# Patient Record
Sex: Female | Born: 1975 | Race: White | Hispanic: No | Marital: Married | Smoking: Never smoker
Health system: Southern US, Community
[De-identification: ages and names within clinical notes are randomized; demographics above are authoritative.]

## PROBLEM LIST (undated history)

## (undated) DIAGNOSIS — O09299 Supervision of pregnancy with other poor reproductive or obstetric history, unspecified trimester: Secondary | ICD-10-CM

## (undated) DIAGNOSIS — R87619 Unspecified abnormal cytological findings in specimens from cervix uteri: Secondary | ICD-10-CM

## (undated) DIAGNOSIS — IMO0002 Reserved for concepts with insufficient information to code with codable children: Secondary | ICD-10-CM

## (undated) DIAGNOSIS — F32A Depression, unspecified: Secondary | ICD-10-CM

## (undated) DIAGNOSIS — F329 Major depressive disorder, single episode, unspecified: Secondary | ICD-10-CM

## (undated) DIAGNOSIS — Z8619 Personal history of other infectious and parasitic diseases: Secondary | ICD-10-CM

## (undated) DIAGNOSIS — Z8632 Personal history of gestational diabetes: Secondary | ICD-10-CM

## (undated) DIAGNOSIS — Z8742 Personal history of other diseases of the female genital tract: Secondary | ICD-10-CM

## (undated) DIAGNOSIS — O09529 Supervision of elderly multigravida, unspecified trimester: Secondary | ICD-10-CM

## (undated) HISTORY — DX: Personal history of other diseases of the female genital tract: Z87.42

## (undated) HISTORY — DX: Supervision of elderly multigravida, unspecified trimester: O09.529

## (undated) HISTORY — DX: Major depressive disorder, single episode, unspecified: F32.9

## (undated) HISTORY — PX: APPENDECTOMY: SHX54

## (undated) HISTORY — DX: Personal history of gestational diabetes: Z86.32

## (undated) HISTORY — DX: Depression, unspecified: F32.A

## (undated) HISTORY — DX: Reserved for concepts with insufficient information to code with codable children: IMO0002

## (undated) HISTORY — DX: Supervision of pregnancy with other poor reproductive or obstetric history, unspecified trimester: O09.299

## (undated) HISTORY — PX: COLPOSCOPY: SHX161

## (undated) HISTORY — DX: Personal history of other infectious and parasitic diseases: Z86.19

## (undated) HISTORY — DX: Unspecified abnormal cytological findings in specimens from cervix uteri: R87.619

---

## 2011-08-25 NOTE — L&D Delivery Note (Signed)
Delivery Note At 11:01 PM a viable female was delivered via Vaginal, Spontaneous Delivery (Presentation: ;  ).  APGAR: 8, 9; weight .   Placenta status: Intact, Spontaneous.  Cord: 3 vessels with the following complications: None.  Cord pH: not sent  Anesthesia: Epidural  Episiotomy: None Lacerations: 2nd degree;Vaginal Suture Repair: 3.0 chromic Est. Blood Loss (mL): 400   bilat superficial periurethral lac repaired, sec deg perineal lac repaired>>I/O cath at end>>clear urine Mom to postpartum.  Baby to nursery-stable.  Meriel Pica 03/26/2012, 11:22 PM

## 2011-09-08 LAB — OB RESULTS CONSOLE ABO/RH: RH Type: POSITIVE

## 2011-09-08 LAB — OB RESULTS CONSOLE HEPATITIS B SURFACE ANTIGEN: Hepatitis B Surface Ag: NEGATIVE

## 2011-09-08 LAB — OB RESULTS CONSOLE HIV ANTIBODY (ROUTINE TESTING): HIV: NONREACTIVE

## 2012-02-03 ENCOUNTER — Encounter: Payer: 59 | Attending: Obstetrics and Gynecology | Admitting: *Deleted

## 2012-02-03 DIAGNOSIS — O99810 Abnormal glucose complicating pregnancy: Secondary | ICD-10-CM | POA: Insufficient documentation

## 2012-02-03 DIAGNOSIS — Z713 Dietary counseling and surveillance: Secondary | ICD-10-CM | POA: Insufficient documentation

## 2012-02-05 ENCOUNTER — Encounter: Payer: Self-pay | Admitting: *Deleted

## 2012-02-05 NOTE — Progress Notes (Signed)
  Patient was seen on 02/03/2012 for Gestational Diabetes self-management class at the [facility]. The following learning objectives were met by the patient during this course:   States the definition of Gestational Diabetes  States why dietary management is important in controlling blood glucose  Describes the effects each nutrient has on blood glucose levels  Demonstrates ability to create a balanced meal plan  Demonstrates carbohydrate counting   States when to check blood glucose levels  Demonstrates proper blood glucose monitoring techniques  States the effect of stress and exercise on blood glucose levels  States the importance of limiting caffeine and abstaining from alcohol and smoking  Blood glucose monitor given:  One Touch Ultra Mini Self Monitoring Kit Lot # [id number] x Exp: 04/2012 Blood glucose reading: 89 mg/dl  Patient instructed to monitor glucose levels: FBS: 60 - <90 2 hour: <120  *Patient received handouts:  Nutrition Diabetes and Pregnancy  Carbohydrate Counting List  Patient will be seen for follow-up as needed.

## 2012-02-05 NOTE — Patient Instructions (Signed)
Goals:  Check glucose levels per MD as instructed  Follow Gestational Diabetes Diet as instructed  Call for follow-up as needed    

## 2012-03-15 ENCOUNTER — Encounter (HOSPITAL_COMMUNITY): Payer: Self-pay | Admitting: *Deleted

## 2012-03-15 ENCOUNTER — Telehealth (HOSPITAL_COMMUNITY): Payer: Self-pay | Admitting: *Deleted

## 2012-03-15 NOTE — Telephone Encounter (Signed)
Preadmission screen  

## 2012-03-24 ENCOUNTER — Encounter (HOSPITAL_COMMUNITY): Payer: Self-pay

## 2012-03-24 ENCOUNTER — Inpatient Hospital Stay (HOSPITAL_COMMUNITY)
Admission: RE | Admit: 2012-03-24 | Discharge: 2012-03-28 | DRG: 775 | Disposition: A | Discharge status: Home / Self Care | Payer: 59 | Source: Ambulatory Visit | Attending: Obstetrics and Gynecology | Admitting: Obstetrics and Gynecology

## 2012-03-24 DIAGNOSIS — O4100X0 Oligohydramnios, unspecified trimester, not applicable or unspecified: Principal | ICD-10-CM | POA: Diagnosis present

## 2012-03-24 DIAGNOSIS — O99814 Abnormal glucose complicating childbirth: Secondary | ICD-10-CM | POA: Diagnosis present

## 2012-03-24 DIAGNOSIS — O09529 Supervision of elderly multigravida, unspecified trimester: Secondary | ICD-10-CM | POA: Diagnosis present

## 2012-03-24 LAB — CBC
HCT: 35.1 % — ABNORMAL LOW (ref 36.0–46.0)
MCHC: 34.8 g/dL (ref 30.0–36.0)
RDW: 14.3 % (ref 11.5–15.5)
WBC: 9.8 10*3/uL (ref 4.0–10.5)

## 2012-03-24 MED ORDER — MISOPROSTOL 25 MCG QUARTER TABLET
25.0000 ug | ORAL_TABLET | ORAL | Status: DC | PRN
  Administered 2012-03-24 – 2012-03-25 (×2): 25 ug via VAGINAL
  Filled 2012-03-24 (×2): qty 0.25

## 2012-03-24 MED ORDER — GLYBURIDE 2.5 MG PO TABS
2.5000 mg | ORAL_TABLET | Freq: Every evening | ORAL | Status: AC
  Administered 2012-03-24: 2.5 mg via ORAL
  Filled 2012-03-24: qty 1

## 2012-03-24 MED ORDER — ONDANSETRON HCL 4 MG/2ML IJ SOLN: 4.0000 mg | Freq: Four times a day (QID) | INTRAMUSCULAR | Status: DC | PRN

## 2012-03-24 MED ORDER — ACETAMINOPHEN 325 MG PO TABS: 650.0000 mg | ORAL_TABLET | ORAL | Status: DC | PRN

## 2012-03-24 MED ORDER — LACTATED RINGERS IV SOLN
INTRAVENOUS | Status: DC
  Administered 2012-03-25 (×2): via INTRAVENOUS
  Administered 2012-03-26: 125 mL/h via INTRAVENOUS
  Administered 2012-03-26: 12:00:00 via INTRAVENOUS

## 2012-03-24 MED ORDER — SODIUM CHLORIDE 0.9 % IV SOLN: 2.0000 g | Freq: Once | INTRAVENOUS | Status: DC

## 2012-03-24 MED ORDER — CITRIC ACID-SODIUM CITRATE 334-500 MG/5ML PO SOLN: 30.0000 mL | ORAL | Status: DC | PRN

## 2012-03-24 MED ORDER — OXYCODONE-ACETAMINOPHEN 5-325 MG PO TABS
1.0000 | ORAL_TABLET | ORAL | Status: DC | PRN
  Administered 2012-03-26: 1 via ORAL
  Filled 2012-03-24: qty 1

## 2012-03-24 MED ORDER — TERBUTALINE SULFATE 1 MG/ML IJ SOLN: 0.2500 mg | Freq: Once | INTRAMUSCULAR | Status: AC | PRN

## 2012-03-24 MED ORDER — IBUPROFEN 600 MG PO TABS
600.0000 mg | ORAL_TABLET | Freq: Four times a day (QID) | ORAL | Status: DC | PRN
  Administered 2012-03-26: 600 mg via ORAL
  Filled 2012-03-24: qty 1

## 2012-03-24 MED ORDER — OXYTOCIN BOLUS FROM INFUSION
250.0000 mL | Freq: Once | INTRAVENOUS | Status: AC
  Administered 2012-03-26: 250 mL via INTRAVENOUS
  Filled 2012-03-24: qty 500

## 2012-03-24 MED ORDER — OXYTOCIN 40 UNITS IN LACTATED RINGERS INFUSION - SIMPLE MED: 62.5000 mL/h | Freq: Once | INTRAVENOUS | Status: DC

## 2012-03-24 MED ORDER — LACTATED RINGERS IV SOLN: 500.0000 mL | INTRAVENOUS | Status: DC | PRN

## 2012-03-24 MED ORDER — FLEET ENEMA 7-19 GM/118ML RE ENEM: 1.0000 | ENEMA | RECTAL | Status: DC | PRN

## 2012-03-24 MED ORDER — SODIUM CHLORIDE 0.9 % IV SOLN
2.0000 g | Freq: Once | INTRAVENOUS | Status: DC
  Filled 2012-03-24: qty 2000

## 2012-03-24 MED ORDER — ZOLPIDEM TARTRATE 5 MG PO TABS
5.0000 mg | ORAL_TABLET | Freq: Every evening | ORAL | Status: DC | PRN
  Administered 2012-03-25 (×2): 5 mg via ORAL
  Filled 2012-03-24 (×3): qty 1

## 2012-03-24 MED ORDER — LIDOCAINE HCL (PF) 1 % IJ SOLN
30.0000 mL | INTRAMUSCULAR | Status: DC | PRN
  Filled 2012-03-24: qty 30

## 2012-03-24 NOTE — H&P (Signed)
Melissa [name] is a 36 y.o. female presenting for induction of labor secondary to oligohydramnios. AF pocket 2.0-2.5 noted. Also, GDM on glyburide. Consultation with Dr Melissa [name] of MFM formulates plan for induction at 37 weeks. Patient denies CNS changes/epigastric pain/ ROM. Maternal Medical History:  Fetal activity: Perceived fetal activity is normal.      OB History    Grav Para Term Preterm Abortions TAB SAB Ect Mult Living   3 0   2  2        Past Medical History  Diagnosis Date  . H/O varicella   . Abnormal Pap smear   . Depression   . AMA (advanced maternal age) multigravida 35+   . History of PCOS    Past Surgical History  Procedure Date  . Appendectomy   . Colposcopy    Family History: family history includes Cancer in her paternal grandfather and paternal grandmother; Diabetes in her mother; Hypertension in her father; Stroke in her mother; and Thyroid disease in her mother. Social History:  reports that she has never smoked. She has never used smokeless tobacco. She reports that she does not drink alcohol or use illicit drugs.   Prenatal Transfer Tool  Maternal Diabetes: Yes:  Diabetes Type:  Insulin/Medication controlled Genetic Screening: Normal Maternal Ultrasounds/Referrals: Abnormal:  Findings:   Other: Please see prenatal record for details Fetal Ultrasounds or other Referrals:  Referred to Materal Fetal Medicine  Maternal Substance Abuse:  No Significant Maternal Medications:  Meds include: Other: see prenatal record Significant Maternal Lab Results:  None Other Comments:  GDM on glyburide  Review of Systems  Eyes: Negative for blurred vision.  Gastrointestinal: Negative for abdominal pain.  Neurological: Negative for headaches.    Dilation: 1 Effacement (%): Thick Station: -3 Exam by:: DrMarland Kitchen Melissa [name] Last menstrual period 07/10/2011. Maternal Exam:  Abdomen: Fetal presentation: vertex     Fetal Exam Fetal Monitor Review: Pattern:  accelerations present.       Physical Exam  Cardiovascular: Normal rate and regular rhythm.   Respiratory: Effort normal and breath sounds normal.  GI: There is no tenderness.  Neurological: She has normal reflexes.   Cx 1/thick/soft/-3/vertex Prenatal labs: ABO, Rh: A/Positive/-- (01/15 0000) Antibody: Negative (01/15 0000) Rubella: Immune (01/15 0000) RPR: Nonreactive (01/15 0000)  HBsAg: Negative (01/15 0000)  HIV: Non-reactive (01/15 0000)  GBS:     Assessment/Plan: 36 yo G3P0 at 36 6/7 weeks with oligohydramnios and DGM on glyburide for 2 stage induction. D/W patient induction and risks including failed induction, fetal distress, emergency cesarean section.   Melissa [name],Melissa [name] 03/24/2012, 8:53 PM

## 2012-03-25 LAB — TYPE AND SCREEN
ABO/RH(D): A POS
Antibody Screen: NEGATIVE

## 2012-03-25 LAB — ABO/RH: ABO/RH(D): A POS

## 2012-03-25 LAB — RPR: RPR Ser Ql: NONREACTIVE

## 2012-03-25 LAB — GLUCOSE, CAPILLARY

## 2012-03-25 MED ORDER — FENTANYL 2.5 MCG/ML BUPIVACAINE 1/10 % EPIDURAL INFUSION (WH - ANES)
14.0000 mL/h | INTRAMUSCULAR | Status: DC
  Administered 2012-03-26 (×2): 14 mL/h via EPIDURAL
  Filled 2012-03-25 (×2): qty 60

## 2012-03-25 MED ORDER — TERBUTALINE SULFATE 1 MG/ML IJ SOLN: 0.2500 mg | Freq: Once | INTRAMUSCULAR | Status: AC | PRN

## 2012-03-25 MED ORDER — EPHEDRINE 5 MG/ML INJ
10.0000 mg | INTRAVENOUS | Status: DC | PRN
  Filled 2012-03-25: qty 4

## 2012-03-25 MED ORDER — PHENYLEPHRINE 40 MCG/ML (10ML) SYRINGE FOR IV PUSH (FOR BLOOD PRESSURE SUPPORT): 80.0000 ug | PREFILLED_SYRINGE | INTRAVENOUS | Status: DC | PRN

## 2012-03-25 MED ORDER — PHENYLEPHRINE 40 MCG/ML (10ML) SYRINGE FOR IV PUSH (FOR BLOOD PRESSURE SUPPORT)
80.0000 ug | PREFILLED_SYRINGE | INTRAVENOUS | Status: DC | PRN
  Filled 2012-03-25: qty 5

## 2012-03-25 MED ORDER — MISOPROSTOL 25 MCG QUARTER TABLET
25.0000 ug | ORAL_TABLET | ORAL | Status: DC | PRN
  Administered 2012-03-25 – 2012-03-26 (×2): 25 ug via VAGINAL
  Filled 2012-03-25 (×2): qty 0.25

## 2012-03-25 MED ORDER — LACTATED RINGERS IV SOLN: 500.0000 mL | Freq: Once | INTRAVENOUS | Status: DC

## 2012-03-25 MED ORDER — OXYTOCIN 40 UNITS IN LACTATED RINGERS INFUSION - SIMPLE MED
1.0000 m[IU]/min | INTRAVENOUS | Status: DC
  Administered 2012-03-25: 2 m[IU]/min via INTRAVENOUS
  Filled 2012-03-25 (×2): qty 1000

## 2012-03-25 MED ORDER — DIPHENHYDRAMINE HCL 50 MG/ML IJ SOLN: 12.5000 mg | INTRAMUSCULAR | Status: DC | PRN

## 2012-03-25 MED ORDER — GLYBURIDE 2.5 MG PO TABS
2.5000 mg | ORAL_TABLET | Freq: Every evening | ORAL | Status: AC
  Administered 2012-03-25: 2.5 mg via ORAL
  Filled 2012-03-25: qty 1

## 2012-03-25 MED ORDER — EPHEDRINE 5 MG/ML INJ
10.0000 mg | INTRAVENOUS | Status: DC | PRN
Start: 2012-03-25 — End: 2012-03-27

## 2012-03-25 NOTE — Progress Notes (Signed)
Patient ID: Melissa [name], female   DOB: December 06, 1975, 36 y.o.   MRN: [id number] Received cytotec X 2>>now 1+/post, will start pitocin per protocol

## 2012-03-25 NOTE — Progress Notes (Signed)
No substantial change in cx, will DC pitocin, allow to eat, shower and re- prime with Cytotec tonight.

## 2012-03-26 ENCOUNTER — Inpatient Hospital Stay (HOSPITAL_COMMUNITY): Payer: 59 | Admitting: Anesthesiology

## 2012-03-26 ENCOUNTER — Encounter (HOSPITAL_COMMUNITY): Payer: Self-pay | Admitting: Anesthesiology

## 2012-03-26 LAB — GLUCOSE, CAPILLARY: Glucose-Capillary: 76 mg/dL (ref 70–99)

## 2012-03-26 MED ORDER — OXYTOCIN 40 UNITS IN LACTATED RINGERS INFUSION - SIMPLE MED
1.0000 m[IU]/min | INTRAVENOUS | Status: DC
  Administered 2012-03-26: 10 m[IU]/min via INTRAVENOUS

## 2012-03-26 MED ORDER — LIDOCAINE HCL (PF) 1 % IJ SOLN
INTRAMUSCULAR | Status: DC | PRN
  Administered 2012-03-26 (×3): 4 mL

## 2012-03-26 MED ORDER — TERBUTALINE SULFATE 1 MG/ML IJ SOLN: 0.2500 mg | Freq: Once | INTRAMUSCULAR | Status: AC | PRN

## 2012-03-26 NOTE — Progress Notes (Signed)
Attempted to check pt cervix--cervix posterior unable to determine SVE

## 2012-03-26 NOTE — Progress Notes (Signed)
Pt sitting up in bed--difficult to trace

## 2012-03-26 NOTE — Progress Notes (Signed)
Patient ID: Melissa [name], female   DOB: 09-Oct-1975, 36 y.o.   MRN: [id number] Now on pit 86miu/mi, no change in cx, discussed CS for failed induction vs day #3 induction , will recheck about 6 PM

## 2012-03-26 NOTE — Progress Notes (Addendum)
Dr Marcelle Overlie notified that glyburide was not ordered for pt after meal. Orders received to give glyburide and take a fasting blood sugar in the am. Will continue to monitor.

## 2012-03-26 NOTE — Anesthesia Preprocedure Evaluation (Signed)
Anesthesia Evaluation  Patient identified by MRN, date of birth, ID band Patient awake    Reviewed: Allergy & Precautions, H&P , NPO status , Patient's Chart, lab work & pertinent test results, reviewed documented beta blocker date and time   History of Anesthesia Complications Negative for: history of anesthetic complications  Airway Mallampati: I TM Distance: >3 FB Neck ROM: full    Dental  (+) Teeth Intact   Pulmonary neg pulmonary ROS,  breath sounds clear to auscultation        Cardiovascular negative cardio ROS  Rhythm:regular Rate:Normal     Neuro/Psych negative neurological ROS  negative psych ROS   GI/Hepatic negative GI ROS, Neg liver ROS,   Endo/Other  Gestational, Oral Hypoglycemic AgentsMorbid obesityPCOS  Renal/GU negative Renal ROS  negative genitourinary   Musculoskeletal   Abdominal   Peds  Hematology negative hematology ROS (+)   Anesthesia Other Findings   Reproductive/Obstetrics (+) Pregnancy                           Anesthesia Physical Anesthesia Plan  ASA: III  Anesthesia Plan: Epidural   Post-op Pain Management:    Induction:   Airway Management Planned:   Additional Equipment:   Intra-op Plan:   Post-operative Plan:   Informed Consent: I have reviewed the patients History and Physical, chart, labs and discussed the procedure including the risks, benefits and alternatives for the proposed anesthesia with the patient or authorized representative who has indicated his/her understanding and acceptance.     Plan Discussed with:   Anesthesia Plan Comments:         Anesthesia Quick Evaluation

## 2012-03-26 NOTE — Anesthesia Procedure Notes (Signed)
Epidural Patient location during procedure: OB Start time: 03/26/2012 7:10 PM Reason for block: procedure for pain  Staffing Performed by: anesthesiologist   Preanesthetic Checklist Completed: patient identified, site marked, surgical consent, pre-op evaluation, timeout performed, IV checked, risks and benefits discussed and monitors and equipment checked  Epidural Patient position: sitting Prep: site prepped and draped and DuraPrep Patient monitoring: continuous pulse ox and blood pressure Approach: midline Injection technique: LOR air  Needle:  Needle type: Tuohy  Needle gauge: 17 G Needle length: 9 cm Needle insertion depth: 5 cm cm Catheter type: closed end flexible Catheter size: 19 Gauge Catheter at skin depth: 10 cm Test dose: negative  Assessment Events: blood not aspirated, injection not painful, no injection resistance, negative IV test and no paresthesia  Additional Notes Discussed risk of headache, infection, bleeding, nerve injury and failed or incomplete block.  Patient voices understanding and wishes to proceed.

## 2012-03-26 NOTE — Progress Notes (Signed)
Dr. Marcelle Overlie notified of pt status, FHR, decelerations, nursing interventions, and SVE. Will continue to monitor.

## 2012-03-26 NOTE — Progress Notes (Signed)
Patient ID: Melissa [name], female   DOB: 18-Mar-1976, 36 y.o.   MRN: [id number] [redacted]w[redacted]d   Day #2 induction, rec'd cytotec again last PM>>cx now 1/thk/post still, stable FHR, will start pitocin per protocol

## 2012-03-26 NOTE — Progress Notes (Signed)
Dr. Marcelle Overlie notified of SVE, FHR and decelerations. MD wants pt to begin pushing. Will continue to monitor.

## 2012-03-26 NOTE — Progress Notes (Signed)
Patient ID: Melissa [name], female   DOB: November 25, 1975, 36 y.o.   MRN: [id number] About to get epidural, SROM about 1 hr ago, now 4 cm

## 2012-03-27 ENCOUNTER — Encounter (HOSPITAL_COMMUNITY): Payer: Self-pay

## 2012-03-27 LAB — CBC
HCT: 34.7 % — ABNORMAL LOW (ref 36.0–46.0)
Hemoglobin: 11.8 g/dL — ABNORMAL LOW (ref 12.0–15.0)
MCV: 91.8 fL (ref 78.0–100.0)
WBC: 13 10*3/uL — ABNORMAL HIGH (ref 4.0–10.5)

## 2012-03-27 MED ORDER — DIPHENHYDRAMINE HCL 25 MG PO CAPS: 25.0000 mg | ORAL_CAPSULE | Freq: Four times a day (QID) | ORAL | Status: DC | PRN

## 2012-03-27 MED ORDER — DIBUCAINE 1 % RE OINT: 1.0000 "application " | TOPICAL_OINTMENT | RECTAL | Status: DC | PRN

## 2012-03-27 MED ORDER — ONDANSETRON HCL 4 MG/2ML IJ SOLN: 4.0000 mg | INTRAMUSCULAR | Status: DC | PRN

## 2012-03-27 MED ORDER — BISACODYL 10 MG RE SUPP: 10.0000 mg | Freq: Every day | RECTAL | Status: DC | PRN

## 2012-03-27 MED ORDER — MEASLES, MUMPS & RUBELLA VAC ~~LOC~~ INJ
0.5000 mL | INJECTION | Freq: Once | SUBCUTANEOUS | Status: DC
  Filled 2012-03-27: qty 0.5

## 2012-03-27 MED ORDER — TETANUS-DIPHTH-ACELL PERTUSSIS 5-2.5-18.5 LF-MCG/0.5 IM SUSP: 0.5000 mL | Freq: Once | INTRAMUSCULAR | Status: DC

## 2012-03-27 MED ORDER — LANOLIN HYDROUS EX OINT: TOPICAL_OINTMENT | CUTANEOUS | Status: DC | PRN

## 2012-03-27 MED ORDER — SIMETHICONE 80 MG PO CHEW: 80.0000 mg | CHEWABLE_TABLET | ORAL | Status: DC | PRN

## 2012-03-27 MED ORDER — ZOLPIDEM TARTRATE 5 MG PO TABS: 5.0000 mg | ORAL_TABLET | Freq: Every evening | ORAL | Status: DC | PRN

## 2012-03-27 MED ORDER — SENNOSIDES-DOCUSATE SODIUM 8.6-50 MG PO TABS
2.0000 | ORAL_TABLET | Freq: Every day | ORAL | Status: DC
  Administered 2012-03-27: 2 via ORAL

## 2012-03-27 MED ORDER — BENZOCAINE-MENTHOL 20-0.5 % EX AERO
1.0000 "application " | INHALATION_SPRAY | CUTANEOUS | Status: DC | PRN
  Administered 2012-03-27: 1 via TOPICAL
  Filled 2012-03-27: qty 56

## 2012-03-27 MED ORDER — IBUPROFEN 800 MG PO TABS
800.0000 mg | ORAL_TABLET | Freq: Three times a day (TID) | ORAL | Status: DC | PRN
  Administered 2012-03-27 – 2012-03-28 (×2): 800 mg via ORAL
  Filled 2012-03-27 (×4): qty 1

## 2012-03-27 MED ORDER — FLEET ENEMA 7-19 GM/118ML RE ENEM: 1.0000 | ENEMA | Freq: Every day | RECTAL | Status: DC | PRN

## 2012-03-27 MED ORDER — WITCH HAZEL-GLYCERIN EX PADS: 1.0000 "application " | MEDICATED_PAD | CUTANEOUS | Status: DC | PRN

## 2012-03-27 MED ORDER — ONDANSETRON HCL 4 MG PO TABS: 4.0000 mg | ORAL_TABLET | ORAL | Status: DC | PRN

## 2012-03-27 MED ORDER — OXYCODONE-ACETAMINOPHEN 5-325 MG PO TABS
1.0000 | ORAL_TABLET | Freq: Four times a day (QID) | ORAL | Status: DC | PRN
  Administered 2012-03-27 – 2012-03-28 (×5): 1 via ORAL
  Filled 2012-03-27 (×5): qty 1

## 2012-03-27 MED ORDER — PRENATAL MULTIVITAMIN CH
1.0000 | ORAL_TABLET | Freq: Every day | ORAL | Status: DC
  Administered 2012-03-27 – 2012-03-28 (×2): 1 via ORAL
  Filled 2012-03-27: qty 1

## 2012-03-27 NOTE — Progress Notes (Signed)
Spoke with MOB briefly, was on prozac for symptoms, plans to restart now that infant has been born.  No current concerns.  Patient was referred for history of depression/anxiety. * Referral screened out by Clinical Social Worker because none of the following criteria appear to apply: ~ History of anxiety/depression during this pregnancy, or of post-partum depression. ~ Diagnosis of anxiety and/or depression within last 3 years ~ History of depression due to pregnancy loss/loss of child OR * Patient's symptoms currently being treated with medication and/or therapy. Please contact the Clinical Social Worker if needs arise, or by the patient's request. 

## 2012-03-27 NOTE — Progress Notes (Signed)
Post Partum Day 1 Subjective: no complaints  Objective: Blood pressure 129/87, pulse 91, temperature 97.8 F (36.6 C), temperature source Oral, resp. rate 20, height 5\' 7"  (1.702 m), weight 225 lb (102.059 kg), last menstrual period 07/10/2011, SpO2 100.00%, unknown if currently breastfeeding.  Physical Exam:  General: alert Lochia: appropriate Uterine Fundus: firm Incision: healing well DVT Evaluation: No evidence of DVT seen on physical exam.   Basename 03/27/12 0520 03/24/12 2115  HGB 11.8* 12.2  HCT 34.7* 35.1*    Assessment/Plan: Plan for discharge tomorrow   LOS: 3 days   Deloss Amico M 03/27/2012, 9:16 AM

## 2012-03-27 NOTE — Anesthesia Postprocedure Evaluation (Signed)
  Anesthesia Post-op Note  Patient: Melissa [name]  Procedure(s) Performed: * No surgery found *  Patient Location: Mother/Baby  Anesthesia Type: Epidural  Level of Consciousness: awake, alert  and oriented  Airway and Oxygen Therapy: Patient Spontanous Breathing  Post-op Pain: mild  Post-op Assessment: Patient's Cardiovascular Status Stable and Respiratory Function Stable  Post-op Vital Signs: stable  Complications: No apparent anesthesia complications

## 2012-03-28 MED ORDER — OXYCODONE-ACETAMINOPHEN 5-325 MG PO TABS: 1.0000 | ORAL_TABLET | Freq: Four times a day (QID) | ORAL | Status: DC | PRN

## 2012-03-28 MED ORDER — IBUPROFEN 800 MG PO TABS: 800.0000 mg | ORAL_TABLET | Freq: Three times a day (TID) | ORAL | Status: DC | PRN

## 2012-03-28 NOTE — Discharge Summary (Signed)
Obstetric Discharge Summary Reason for Admission: induction of labor Prenatal Procedures: ultrasound Intrapartum Procedures: spontaneous vaginal delivery Postpartum Procedures: none Complications-Operative and Postpartum: 2 degree perineal laceration Hemoglobin  Date Value Range Status  03/27/2012 11.8* 12.0 - 15.0 g/dL Final     HCT  Date Value Range Status  03/27/2012 34.7* 36.0 - 46.0 % Final    Physical Exam:  General: alert and cooperative Lochia: appropriate Uterine Fundus: firm Incision: perineum intact DVT Evaluation: No evidence of DVT seen on physical exam.  Discharge Diagnoses: Term Pregnancy-delivered  Discharge Information: Date: 03/28/2012 Activity: pelvic rest Diet: routine Medications: PNV, Ibuprofen and Percocet Condition: stable Instructions: refer to practice specific booklet Discharge to: home   Newborn Data: Live born female  Birth Weight: 6 lb 15.8 oz (3170 g) APGAR: 8, 9  Home with mother.  Chane Cowden G 03/28/2012, 8:19 AM

## 2012-04-03 ENCOUNTER — Inpatient Hospital Stay (HOSPITAL_COMMUNITY)
Admission: AD | Admit: 2012-04-03 | Discharge: 2012-04-03 | DRG: 776 | Disposition: A | Discharge status: Home / Self Care | Payer: 59 | Source: Ambulatory Visit | Attending: Obstetrics and Gynecology | Admitting: Obstetrics and Gynecology

## 2012-04-03 ENCOUNTER — Encounter (HOSPITAL_COMMUNITY): Payer: Self-pay

## 2012-04-03 DIAGNOSIS — O135 Gestational [pregnancy-induced] hypertension without significant proteinuria, complicating the puerperium: Secondary | ICD-10-CM | POA: Diagnosis present

## 2012-04-03 DIAGNOSIS — O8612 Endometritis following delivery: Secondary | ICD-10-CM

## 2012-04-03 LAB — COMPREHENSIVE METABOLIC PANEL
Alkaline Phosphatase: 78 U/L (ref 39–117)
BUN: 14 mg/dL (ref 6–23)
GFR calc Af Amer: >90 mL/min (ref >90)
GFR calc non Af Amer: >90 mL/min (ref >90)
Glucose, Bld: 115 mg/dL — ABNORMAL HIGH (ref 70–99)
Potassium: 3.5 mEq/L (ref 3.5–5.1)
Total Bilirubin: 0.3 mg/dL (ref 0.3–1.2)
Total Protein: 5.8 g/dL — ABNORMAL LOW (ref 6.0–8.3)

## 2012-04-03 LAB — URINALYSIS, ROUTINE W REFLEX MICROSCOPIC
Bilirubin Urine: NEGATIVE
Nitrite: NEGATIVE
Specific Gravity, Urine: 1.02 (ref 1.005–1.030)
pH: 5.5 (ref 5.0–8.0)

## 2012-04-03 LAB — CBC WITH DIFFERENTIAL/PLATELET
Eosinophils Absolute: 0 10*3/uL (ref 0.0–0.7)
Hemoglobin: 11.9 g/dL — ABNORMAL LOW (ref 12.0–15.0)
Lymphs Abs: 1.2 10*3/uL (ref 0.7–4.0)
MCH: 31.2 pg (ref 26.0–34.0)
MCV: 91.6 fL (ref 78.0–100.0)
Monocytes Relative: 7 % (ref 3–12)
Neutrophils Relative %: 85 % — ABNORMAL HIGH (ref 43–77)
RBC: 3.81 MIL/uL — ABNORMAL LOW (ref 3.87–5.11)

## 2012-04-03 LAB — PROTEIN / CREATININE RATIO, URINE: Creatinine, Urine: 153.53 mg/dL

## 2012-04-03 MED ORDER — SODIUM CHLORIDE 0.9 % IV SOLN
3.0000 g | Freq: Four times a day (QID) | INTRAVENOUS | Status: DC
  Administered 2012-04-03: 3 g via INTRAVENOUS
  Filled 2012-04-03 (×2): qty 3

## 2012-04-03 MED ORDER — OXYCODONE-ACETAMINOPHEN 5-325 MG PO TABS: 1.0000 | ORAL_TABLET | ORAL | Status: DC | PRN

## 2012-04-03 MED ORDER — ONDANSETRON HCL 4 MG PO TABS: 4.0000 mg | ORAL_TABLET | Freq: Four times a day (QID) | ORAL | Status: DC | PRN

## 2012-04-03 MED ORDER — SODIUM CHLORIDE 0.9 % IV SOLN
INTRAVENOUS | Status: DC
  Administered 2012-04-03: 03:00:00 via INTRAVENOUS

## 2012-04-03 MED ORDER — SODIUM CHLORIDE 0.9 % IV SOLN
3.0000 g | Freq: Once | INTRAVENOUS | Status: AC
  Administered 2012-04-03: 3 g via INTRAVENOUS
  Filled 2012-04-03: qty 3

## 2012-04-03 MED ORDER — IBUPROFEN 600 MG PO TABS: 600.0000 mg | ORAL_TABLET | Freq: Four times a day (QID) | ORAL | Status: DC | PRN

## 2012-04-03 MED ORDER — SODIUM CHLORIDE 0.9 % IJ SOLN: 3.0000 mL | Freq: Two times a day (BID) | INTRAMUSCULAR | Status: DC

## 2012-04-03 MED ORDER — PRENATAL MULTIVITAMIN CH: 1.0000 | ORAL_TABLET | Freq: Every day | ORAL | Status: DC

## 2012-04-03 MED ORDER — IBUPROFEN 600 MG PO TABS
600.0000 mg | ORAL_TABLET | Freq: Once | ORAL | Status: AC
  Administered 2012-04-03: 600 mg via ORAL
  Filled 2012-04-03: qty 1

## 2012-04-03 MED ORDER — ONDANSETRON HCL 4 MG/2ML IJ SOLN: 4.0000 mg | Freq: Four times a day (QID) | INTRAMUSCULAR | Status: DC | PRN

## 2012-04-03 MED ORDER — ZOLPIDEM TARTRATE 5 MG PO TABS: 5.0000 mg | ORAL_TABLET | Freq: Every evening | ORAL | Status: DC | PRN

## 2012-04-03 NOTE — Progress Notes (Signed)
Subjective: Patient reports tolerating PO and no problems voiding.  No nausea and abdominal pain better  Objective: I have reviewed patient's vital signs and labs.  General: alert GI: soft, non-tender; bowel sounds normal; no masses,  no organomegaly   Assessment/Plan: Endomyometritis resolving D/c home with antibiotics  LOS: 0 days    Melissa [name] S 04/03/2012, 7:52 AM

## 2012-04-03 NOTE — Consult Note (Signed)
I was asked to see this  61 week old baby and mom , where mom is a patient on women's Unit with endometritis. The pediatrician has been following the baby's weight closely, and wanted a weight done. Baby was 6 -9 at birth, and today was 6 - 1.9 - at one point after delivery, she was up to 6 5, so this was a loss. Mom has ben supplementing  Every 6 hours with an ounce of formula. On exam, mom has wide set ,cone shaped breasts, her right  Much smaller than left. I had mom pump with a manual pump, and she expressed about 1-2 mls. I explained that her supply right not is probably  Low, and some of that may be caused by her infection. I also told her that, unfortunately, she had the classic type fo breasts that may not provide full nurtition for her baby. I encouraged her to keep breast feeding, and continue to supplement, and to increase the supplement to very 3 hours. The pediatrician's office did call to et baby's weight while I was in the room, so I confirmed with mom that they will have follow up with baby's MD. The baby is also going to lab and have a bili done today. Mom plans to come in for an outpatient lactation consult.

## 2012-04-03 NOTE — MAU Note (Signed)
Pt states, " I delivered vaginally last Sat. Ii've had body aches for the past few days and then I started having chills at 4 pm, and at 11 pm my temp was 101.8. My breast fell tingly but not engorged. I have low back ache and some crampy pain in my low abdominal for a couple of days. I've have burning when I pee since delivery, but worse today."

## 2012-04-03 NOTE — Discharge Summary (Signed)
Physician Discharge Summary  Patient ID: Melissa [name] MRN: [id number] DOB/AGE: [birth year] 36 y.o.  Admit date: 04/03/2012 Discharge date: 04/03/2012  Admission Diagnoses: Endomyometritis  Discharge Diagnoses: Endomyometritis resolving Active Problems:  * No active hospital problems. *    Discharged Condition: good  Hospital Course: Patient was admitted with endomyometritis. Was treated with Unasyn. Became afebrile. The morning of discharge she was tolerating her diet with minimal nausea. Her abdominal pain and tenderness had resolved.  Consults: None  Significant Diagnostic Studies: labs: elevated wbc  Treatments: antibiotics: Unasyn  Discharge Exam: Blood pressure 123/84, pulse 84, temperature 98.6 F (37 C), temperature source Oral, resp. rate 18, height 5\' 7"  (1.702 m), weight 94.405 kg (208 lb 2 oz), last menstrual period 07/10/2011, SpO2 97.00%, currently breastfeeding. General appearance: alert GI: soft, non-tender; bowel sounds normal; no masses,  no organomegaly  Disposition: 01-Home or Self Care  Discharge Orders    Future Appointments: Provider: Department: Dept Phone: Center:   04/04/2012 10:30 AM [facility] [phone] None     Medication List  As of 04/03/2012  7:54 AM   ASK your doctor about these medications         ibuprofen 800 MG tablet   Commonly known as: ADVIL,MOTRIN   Take 800 mg by mouth every 8 (eight) hours as needed. For pain      oxyCODONE-acetaminophen 5-325 MG per tablet   Commonly known as: PERCOCET/ROXICET   Take 1 tablet by mouth every 6 (six) hours as needed. For severe pain      prenatal multivitamin Tabs   Take 1 tablet by mouth daily.             SignedRichardean Chimera S 04/03/2012, 7:54 AM

## 2012-04-03 NOTE — MAU Provider Note (Signed)
History     CSN: [id number]  Arrival date and time: 04/03/12 0014   None     Chief Complaint  Patient presents with  . Fever  . Back Pain  . Abdominal Pain  . Breast Pain   HPI This is a 36 y.o. female who is just over a week postpartum from a vaginal delivery on August 3. She presents with c/o fever and body aches for the past few days. Lower abdominal pain and pain with urination.  Not much breast pain. Milk came in 2 days ago. No problems with feeding. No complications with delivery.  Had a second degree laceration. No history of hypertension.  RN Note: Pt states, " I delivered vaginally last Sat. Ii've had body aches for the past few days and then I started having chills at 4 pm, and at 11 pm my temp was 101.8. My breast fell tingly but not engorged. I have low back ache and some crampy pain in my low abdominal for a couple of days. I've have burning when I pee since delivery, but worse today."   OB History    Grav Para Term Preterm Abortions TAB SAB Ect Mult Living   3 1 1  2  2   1       Past Medical History  Diagnosis Date  . H/O varicella   . Abnormal Pap smear   . Depression   . AMA (advanced maternal age) multigravida 35+   . History of PCOS     Past Surgical History  Procedure Date  . Appendectomy   . Colposcopy     Family History  Problem Relation Age of Onset  . Thyroid disease Mother   . Diabetes Mother   . Stroke Mother   . Hypertension Father   . Cancer Paternal Grandmother     breast  . Cancer Paternal Grandfather     lung    History  Substance Use Topics  . Smoking status: Never Smoker   . Smokeless tobacco: Never Used  . Alcohol Use: No    Allergies: No Known Allergies  Prescriptions prior to admission  Medication Sig Dispense Refill  . ibuprofen (ADVIL,MOTRIN) 800 MG tablet Take 1 tablet (800 mg total) by mouth every 8 (eight) hours as needed.  30 tablet  1  . oxyCODONE-acetaminophen (PERCOCET/ROXICET) 5-325 MG per tablet Take 1-2  tablets by mouth every 6 (six) hours as needed.  30 tablet  0  . Prenatal Vit-Fe Fumarate-FA (PRENATAL MULTIVITAMIN) TABS Take 1 tablet by mouth daily.        ROS See HPI  Physical Exam   Blood pressure 142/110, pulse 116, temperature 100.6 F (38.1 C), temperature source Oral, resp. rate 16, height 5\' 7"  (1.702 m), weight 208 lb 2 oz (94.405 kg), last menstrual period 07/10/2011, currently breastfeeding.  Physical Exam  Constitutional: She is oriented to person, place, and time. She appears well-developed and well-nourished. No distress.  HENT:  Head: Normocephalic.  Cardiovascular: Normal rate, regular rhythm and normal heart sounds.   Respiratory: Effort normal and breath sounds normal. No respiratory distress. She has no wheezes. She has no rales. She exhibits no tenderness.  GI: Soft. She exhibits no distension and no mass. There is tenderness (Lower abdomen, uterus tender). There is no rebound and no guarding.  Genitourinary: Uterus normal. Vaginal discharge (bloody/lochia.   Uterus 12 wk size, tender) found.  Musculoskeletal: Normal range of motion. She exhibits edema (trace to 1+).  Neurological: She is alert and oriented  to person, place, and time. She displays abnormal reflex (brisk, no clonus).  Skin: Skin is warm and dry. She is not diaphoretic.  Psychiatric: She has a normal mood and affect.   Results for orders placed during the hospital encounter of 04/03/12 (from the past 24 hour(s))  URINALYSIS, ROUTINE W REFLEX MICROSCOPIC     Status: Abnormal   Collection Time   04/03/12 12:21 AM      Component Value Range   Color, Urine YELLOW  YELLOW   APPearance CLEAR  CLEAR   Specific Gravity, Urine 1.020  1.005 - 1.030   pH 5.5  5.0 - 8.0   Glucose, UA NEGATIVE  NEGATIVE mg/dL   Hgb urine dipstick LARGE (*) NEGATIVE   Bilirubin Urine NEGATIVE  NEGATIVE   Ketones, ur NEGATIVE  NEGATIVE mg/dL   Protein, ur NEGATIVE  NEGATIVE mg/dL   Urobilinogen, UA 0.2  0.0 - 1.0 mg/dL    Nitrite NEGATIVE  NEGATIVE   Leukocytes, UA NEGATIVE  NEGATIVE  URINE MICROSCOPIC-ADD ON     Status: Abnormal   Collection Time   04/03/12 12:21 AM      Component Value Range   Squamous Epithelial / LPF FEW (*) RARE   WBC, UA 0-2  <3 WBC/hpf   RBC / HPF 3-6  <3 RBC/hpf   Bacteria, UA FEW (*) RARE  CBC WITH DIFFERENTIAL     Status: Abnormal   Collection Time   04/03/12  1:16 AM      Component Value Range   WBC 15.4 (*) 4.0 - 10.5 K/uL   RBC 3.81 (*) 3.87 - 5.11 MIL/uL   Hemoglobin 11.9 (*) 12.0 - 15.0 g/dL   HCT 09.8 (*) 11.9 - 14.7 %   MCV 91.6  78.0 - 100.0 fL   MCH 31.2  26.0 - 34.0 pg   MCHC 34.1  30.0 - 36.0 g/dL   RDW 82.9  56.2 - 13.0 %   Platelets 257  150 - 400 K/uL   Neutrophils Relative 85 (*) 43 - 77 %   Neutro Abs 13.1 (*) 1.7 - 7.7 K/uL   Lymphocytes Relative 8 (*) 12 - 46 %   Lymphs Abs 1.2  0.7 - 4.0 K/uL   Monocytes Relative 7  3 - 12 %   Monocytes Absolute 1.1 (*) 0.1 - 1.0 K/uL   Eosinophils Relative 0  0 - 5 %   Eosinophils Absolute 0.0  0.0 - 0.7 K/uL   Basophils Relative 0  0 - 1 %   Basophils Absolute 0.0  0.0 - 0.1 K/uL  COMPREHENSIVE METABOLIC PANEL     Status: Abnormal   Collection Time   04/03/12  1:16 AM      Component Value Range   Sodium 134 (*) 135 - 145 mEq/L   Potassium 3.5  3.5 - 5.1 mEq/L   Chloride 101  96 - 112 mEq/L   CO2 23  19 - 32 mEq/L   Glucose, Bld 115 (*) 70 - 99 mg/dL   BUN 14  6 - 23 mg/dL   Creatinine, Ser 8.65  0.50 - 1.10 mg/dL   Calcium 9.0  8.4 - 78.4 mg/dL   Total Protein 5.8 (*) 6.0 - 8.3 g/dL   Albumin 3.2 (*) 3.5 - 5.2 g/dL   AST 11  0 - 37 U/L   ALT 12  0 - 35 U/L   Alkaline Phosphatase 78  39 - 117 U/L   Total Bilirubin 0.3  0.3 - 1.2 mg/dL  GFR calc non Af Amer >90  >90 mL/min   GFR calc Af Amer >90  >90 mL/min   Filed Vitals:   04/03/12 0034 04/03/12 0100 04/03/12 0115  BP: 142/110 136/79 138/77  Pulse: 116 94 92  Temp: 100.6 F (38.1 C)    TempSrc: Oral    Resp: 16 18 16   Height: 5\' 7"  (1.702 m)      Weight: 208 lb 2 oz (94.405 kg)      MAU Course  Procedures  MDM Will check PIH labs.  Suspect fever related to endometritis or UTI.  Cath UA pending.   Assessment and Plan  A:  Postpartum, 1 week      Endometris      Postpartum labile hypertension, without evidence of preeclampsia  P:  Admit per Dr Violeta Gelinas      MD to follow  [facility] 04/03/2012, 12:49 AM

## 2012-04-03 NOTE — Progress Notes (Signed)
Discharge instructions reviewed with patient and significant other.  Both state per "Teach Back" understanding of home care and signs/symptoms to report to MD.  No home equipment given.  Prescription given to patient.  Patient ambulated to car with staff without incident and discharged to care of significant other.

## 2012-04-03 NOTE — H&P (Signed)
Melissa Melissa [name] is an 36 y.o.  Presents with fever, abdominal pain and nausea.  Admitted with endomyometritis.  Pertinent Gynecological History: Menses: postpartum Bleeding: na Contraception: none DES exposure: denies Blood transfusions: none Sexually transmitted diseases: no past history Previous GYN Procedures: see PNR  Last mammogram: na Date:  Last pap: normal Date:  OB History: G1, P1   Menstrual History: Menarche age: na Patient'Melissa [name] last menstrual period was 07/10/2011.    Past Medical History  Diagnosis Date  . H/O varicella   . Abnormal Pap smear   . Depression   . AMA (advanced maternal age) multigravida 35+   . History of PCOS     Past Surgical History  Procedure Date  . Appendectomy   . Colposcopy     Family History  Problem Relation Age of Onset  . Thyroid disease Mother   . Diabetes Mother   . Stroke Mother   . Hypertension Father   . Cancer Paternal Grandmother     breast  . Cancer Paternal Grandfather     lung    Social History:  reports that she has never smoked. She has never used smokeless tobacco. She reports that she does not drink alcohol or use illicit drugs.  Allergies: No Known Allergies  Prescriptions prior to admission  Medication Sig Dispense Refill  . ibuprofen (ADVIL,MOTRIN) 800 MG tablet Take 1 tablet (800 mg total) by mouth every 8 (eight) hours as needed.  30 tablet  1  . oxyCODONE-acetaminophen (PERCOCET/ROXICET) 5-325 MG per tablet Take 1-2 tablets by mouth every 6 (six) hours as needed.  30 tablet  0  . Prenatal Vit-Fe Fumarate-FA (PRENATAL MULTIVITAMIN) TABS Take 1 tablet by mouth daily.        Review of Systems  Gastrointestinal: Positive for nausea, vomiting and abdominal pain.    Blood pressure 123/84, pulse 84, temperature 98.6 F (37 C), temperature source Oral, resp. rate 18, height 5\' 7"  (1.702 m), weight 94.405 kg (208 lb 2 oz), last menstrual period 07/10/2011, SpO2 97.00%, currently breastfeeding. Physical  Exam  Cardiovascular: Normal rate, regular rhythm and normal heart sounds.   Respiratory: Effort normal and breath sounds normal.  GI: There is tenderness. There is no rebound and no guarding.  Musculoskeletal: She exhibits edema. She exhibits no tenderness.  Neurological: She is alert. She has normal reflexes.    Results for orders placed during the hospital encounter of 04/03/12 (from the past 24 hour(Melissa [name]))  URINALYSIS, ROUTINE W REFLEX MICROSCOPIC     Status: Abnormal   Collection Time   04/03/12 12:21 AM      Component Value Range   Color, Urine YELLOW  YELLOW   APPearance CLEAR  CLEAR   Specific Gravity, Urine 1.020  1.005 - 1.030   pH 5.5  5.0 - 8.0   Glucose, UA NEGATIVE  NEGATIVE mg/dL   Hgb urine dipstick LARGE (*) NEGATIVE   Bilirubin Urine NEGATIVE  NEGATIVE   Ketones, ur NEGATIVE  NEGATIVE mg/dL   Protein, ur NEGATIVE  NEGATIVE mg/dL   Urobilinogen, UA 0.2  0.0 - 1.0 mg/dL   Nitrite NEGATIVE  NEGATIVE   Leukocytes, UA NEGATIVE  NEGATIVE  URINE MICROSCOPIC-ADD ON     Status: Abnormal   Collection Time   04/03/12 12:21 AM      Component Value Range   Squamous Epithelial / LPF FEW (*) RARE   WBC, UA 0-2  <3 WBC/hpf   RBC / HPF 3-6  <3 RBC/hpf   Bacteria, UA FEW (*) RARE  PROTEIN / CREATININE RATIO, URINE     Status: Normal   Collection Time   04/03/12  1:01 AM      Component Value Range   Creatinine, Urine 153.53     Total Protein, Urine 5.8     PROTEIN CREATININE RATIO 0.04  0.00 - 0.15  CBC WITH DIFFERENTIAL     Status: Abnormal   Collection Time   04/03/12  1:16 AM      Component Value Range   WBC 15.4 (*) 4.0 - 10.5 K/uL   RBC 3.81 (*) 3.87 - 5.11 MIL/uL   Hemoglobin 11.9 (*) 12.0 - 15.0 g/dL   HCT 16.1 (*) 09.6 - 04.5 %   MCV 91.6  78.0 - 100.0 fL   MCH 31.2  26.0 - 34.0 pg   MCHC 34.1  30.0 - 36.0 g/dL   RDW 40.9  81.1 - 91.4 %   Platelets 257  150 - 400 K/uL   Neutrophils Relative 85 (*) 43 - 77 %   Neutro Abs 13.1 (*) 1.7 - 7.7 K/uL   Lymphocytes  Relative 8 (*) 12 - 46 %   Lymphs Abs 1.2  0.7 - 4.0 K/uL   Monocytes Relative 7  3 - 12 %   Monocytes Absolute 1.1 (*) 0.1 - 1.0 K/uL   Eosinophils Relative 0  0 - 5 %   Eosinophils Absolute 0.0  0.0 - 0.7 K/uL   Basophils Relative 0  0 - 1 %   Basophils Absolute 0.0  0.0 - 0.1 K/uL  COMPREHENSIVE METABOLIC PANEL     Status: Abnormal   Collection Time   04/03/12  1:16 AM      Component Value Range   Sodium 134 (*) 135 - 145 mEq/L   Potassium 3.5  3.5 - 5.1 mEq/L   Chloride 101  96 - 112 mEq/L   CO2 23  19 - 32 mEq/L   Glucose, Bld 115 (*) 70 - 99 mg/dL   BUN 14  6 - 23 mg/dL   Creatinine, Ser 7.82  0.50 - 1.10 mg/dL   Calcium 9.0  8.4 - 95.6 mg/dL   Total Protein 5.8 (*) 6.0 - 8.3 g/dL   Albumin 3.2 (*) 3.5 - 5.2 g/dL   AST 11  0 - 37 U/L   ALT 12  0 - 35 U/L   Alkaline Phosphatase 78  39 - 117 U/L   Total Bilirubin 0.3  0.3 - 1.2 mg/dL   GFR calc non Af Amer >90  >90 mL/min   GFR calc Af Amer >90  >90 mL/min    No results found.  Assessment/Plan: edomyometritis admitt for antibiotics   Melissa Melissa [name] 04/03/2012, 7:44 AM

## 2012-04-03 NOTE — MAU Note (Signed)
Patient had a normal vaginal delivery a week ago. She is in with c/o headache, fever, back pain, burning with urination. She states that she last took ibuprophen 800mg  at 10am. Denies visual disturbance, but have epigastric tenderness.

## 2012-04-03 NOTE — H&P (Signed)
History     CSN: [id number]  Arrival date and time: 04/03/12 0014   None     Chief Complaint  Patient presents with  . Fever  . Back Pain  . Abdominal Pain  . Breast Pain   HPI This is a 36 y.o. female who is just over a week postpartum from a vaginal delivery on August 3. She presents with c/o fever and body aches for the past few days. Lower abdominal pain and pain with urination.  Not much breast pain. Milk came in 2 days ago. No problems with feeding. No complications with delivery.  Had a second degree laceration. No history of hypertension.  RN Note: Pt states, " I delivered vaginally last Sat. Ii've had body aches for the past few days and then I started having chills at 4 pm, and at 11 pm my temp was 101.8. My breast fell tingly but not engorged. I have low back ache and some crampy pain in my low abdominal for a couple of days. I've have burning when I pee since delivery, but worse today."   OB History    Grav Para Term Preterm Abortions TAB SAB Ect Mult Living   3 1 1  2  2   1      Past Medical History  Diagnosis Date  . H/O varicella   . Abnormal Pap smear   . Depression   . AMA (advanced maternal age) multigravida 35+   . History of PCOS     Past Surgical History  Procedure Date  . Appendectomy   . Colposcopy     Family History  Problem Relation Age of Onset  . Thyroid disease Mother   . Diabetes Mother   . Stroke Mother   . Hypertension Father   . Cancer Paternal Grandmother     breast  . Cancer Paternal Grandfather     lung    History  Substance Use Topics  . Smoking status: Never Smoker   . Smokeless tobacco: Never Used  . Alcohol Use: No    Allergies: No Known Allergies  Prescriptions prior to admission  Medication Sig Dispense Refill  . ibuprofen (ADVIL,MOTRIN) 800 MG tablet Take 1 tablet (800 mg total) by mouth every 8 (eight) hours as needed.  30 tablet  1  . oxyCODONE-acetaminophen (PERCOCET/ROXICET) 5-325 MG per tablet Take 1-2  tablets by mouth every 6 (six) hours as needed.  30 tablet  0  . Prenatal Vit-Fe Fumarate-FA (PRENATAL MULTIVITAMIN) TABS Take 1 tablet by mouth daily.        ROS See HPI  Physical Exam   Blood pressure 142/110, pulse 116, temperature 100.6 F (38.1 C), temperature source Oral, resp. rate 16, height 5' 7" (1.702 m), weight 208 lb 2 oz (94.405 kg), last menstrual period 07/10/2011, currently breastfeeding.  Physical Exam  Constitutional: She is oriented to person, place, and time. She appears well-developed and well-nourished. No distress.  HENT:  Head: Normocephalic.  Cardiovascular: Normal rate, regular rhythm and normal heart sounds.   Respiratory: Effort normal and breath sounds normal. No respiratory distress. She has no wheezes. She has no rales. She exhibits no tenderness.  GI: Soft. She exhibits no distension and no mass. There is tenderness (Lower abdomen, uterus tender). There is no rebound and no guarding.  Genitourinary: Uterus normal. Vaginal discharge (bloody/lochia.   Uterus 12 wk size, tender) found.  Musculoskeletal: Normal range of motion. She exhibits edema (trace to 1+).  Neurological: She is alert and oriented   to person, place, and time. She displays abnormal reflex (brisk, no clonus).  Skin: Skin is warm and dry. She is not diaphoretic.  Psychiatric: She has a normal mood and affect.   Results for orders placed during the hospital encounter of 04/03/12 (from the past 24 hour(s))  URINALYSIS, ROUTINE W REFLEX MICROSCOPIC     Status: Abnormal   Collection Time   04/03/12 12:21 AM      Component Value Range   Color, Urine YELLOW  YELLOW   APPearance CLEAR  CLEAR   Specific Gravity, Urine 1.020  1.005 - 1.030   pH 5.5  5.0 - 8.0   Glucose, UA NEGATIVE  NEGATIVE mg/dL   Hgb urine dipstick LARGE (*) NEGATIVE   Bilirubin Urine NEGATIVE  NEGATIVE   Ketones, ur NEGATIVE  NEGATIVE mg/dL   Protein, ur NEGATIVE  NEGATIVE mg/dL   Urobilinogen, UA 0.2  0.0 - 1.0 mg/dL    Nitrite NEGATIVE  NEGATIVE   Leukocytes, UA NEGATIVE  NEGATIVE  URINE MICROSCOPIC-ADD ON     Status: Abnormal   Collection Time   04/03/12 12:21 AM      Component Value Range   Squamous Epithelial / LPF FEW (*) RARE   WBC, UA 0-2  <3 WBC/hpf   RBC / HPF 3-6  <3 RBC/hpf   Bacteria, UA FEW (*) RARE  CBC WITH DIFFERENTIAL     Status: Abnormal   Collection Time   04/03/12  1:16 AM      Component Value Range   WBC 15.4 (*) 4.0 - 10.5 K/uL   RBC 3.81 (*) 3.87 - 5.11 MIL/uL   Hemoglobin 11.9 (*) 12.0 - 15.0 g/dL   HCT 34.9 (*) 36.0 - 46.0 %   MCV 91.6  78.0 - 100.0 fL   MCH 31.2  26.0 - 34.0 pg   MCHC 34.1  30.0 - 36.0 g/dL   RDW 13.6  11.5 - 15.5 %   Platelets 257  150 - 400 K/uL   Neutrophils Relative 85 (*) 43 - 77 %   Neutro Abs 13.1 (*) 1.7 - 7.7 K/uL   Lymphocytes Relative 8 (*) 12 - 46 %   Lymphs Abs 1.2  0.7 - 4.0 K/uL   Monocytes Relative 7  3 - 12 %   Monocytes Absolute 1.1 (*) 0.1 - 1.0 K/uL   Eosinophils Relative 0  0 - 5 %   Eosinophils Absolute 0.0  0.0 - 0.7 K/uL   Basophils Relative 0  0 - 1 %   Basophils Absolute 0.0  0.0 - 0.1 K/uL  COMPREHENSIVE METABOLIC PANEL     Status: Abnormal   Collection Time   04/03/12  1:16 AM      Component Value Range   Sodium 134 (*) 135 - 145 mEq/L   Potassium 3.5  3.5 - 5.1 mEq/L   Chloride 101  96 - 112 mEq/L   CO2 23  19 - 32 mEq/L   Glucose, Bld 115 (*) 70 - 99 mg/dL   BUN 14  6 - 23 mg/dL   Creatinine, Ser 0.80  0.50 - 1.10 mg/dL   Calcium 9.0  8.4 - 10.5 mg/dL   Total Protein 5.8 (*) 6.0 - 8.3 g/dL   Albumin 3.2 (*) 3.5 - 5.2 g/dL   AST 11  0 - 37 U/L   ALT 12  0 - 35 U/L   Alkaline Phosphatase 78  39 - 117 U/L   Total Bilirubin 0.3  0.3 - 1.2 mg/dL     GFR calc non Af Amer >90  >90 mL/min   GFR calc Af Amer >90  >90 mL/min   Filed Vitals:   04/03/12 0034 04/03/12 0100 04/03/12 0115  BP: 142/110 136/79 138/77  Pulse: 116 94 92  Temp: 100.6 F (38.1 C)    TempSrc: Oral    Resp: 16 18 16  Height: 5' 7" (1.702 m)      Weight: 208 lb 2 oz (94.405 kg)      MAU Course  Procedures  MDM Will check PIH labs.  Suspect fever related to endometritis or UTI.  Cath UA pending.   Assessment and Plan  A:  Postpartum, 1 week      Endometris      Postpartum labile hypertension, without evidence of preeclampsia  P:  Admit per Dr McComb      IV Unasyn      MD to follow  Wane Mollett 04/03/2012, 12:49 AM  

## 2012-04-04 ENCOUNTER — Ambulatory Visit (HOSPITAL_COMMUNITY): Admission: RE | Admit: 2012-04-04 | Payer: 59 | Source: Ambulatory Visit

## 2012-04-07 ENCOUNTER — Ambulatory Visit (HOSPITAL_COMMUNITY)
Admission: RE | Admit: 2012-04-07 | Discharge: 2012-04-07 | Disposition: A | Discharge status: Home / Self Care | Payer: 59 | Source: Ambulatory Visit | Attending: Obstetrics and Gynecology | Admitting: Obstetrics and Gynecology

## 2012-04-07 NOTE — Progress Notes (Signed)
Adult Lactation Consultation Outpatient Visit Note  Patient Name: Melissa [name] Date of Birth: 10/03/1975 Gestational Age at Delivery: [redacted]w[redacted]d Type of Delivery:   Mom has history of infertility and 2 miscarriages prior to this delivery. Also has history of PCOS.   Breastfeeding History: Frequency of Breastfeeding: q 1-3 hours Length of Feeding: 30-45 min Voids: 6 Stools: 2  Supplementing / Method: Mom has been supplementing with 1 oz formula pc due to slow weight gain Pumping:  Type of Pump: manual    Frequency:  Volume:    Comments:Mom has not been pumping - waiting for DEBP from insurance company to arrive. States she is able to get one from [employer]- tomorrow or Monday. Encouraged to call asap and get stared pumping    Consultation Evaluation:  Initial Feeding Assessment: Pre-feed Weight: 6-5.7 2884 g Post-feed Weight: 6- 5.7  2884g Amount Transferred:0 Comments: Baby nursed on left breast for 15 minutes. Lots of non nutritive sucking noted. Only 1-2 swallows heard during that time. Mom reports that she sees baby's chin moving but feels like Melissa [name] is only pacifying   Additional Feeding Assessment: Pre-feed Weight: 6- 5.7  2884g Post-feed Weight: 6- 5.7  2884g Amount Transferred:0 Comments: Melissa [name] nursed for 15 minutes on right breast. Again very few swallows heard and lots of non nutritive sucking noted. Mom reports that this is how Melissa [name] usually feeds.  Additional Feeding Assessment: Pre-feed Weight: Post-feed Weight: Amount Transferred: Comments:  Total Breast milk Transferred this Visit: 0 Total Supplement Given: 2 oz formula by bottle weight after bottle feeding 6- 8  2958g  Additional Interventions: Encouraged mom to give Melissa [name] 1 1/2 - 2 oz pc formula. Reviewed signs of good latch, deep sucks and to listen for swallows To get a DEBP asap and start pumping 6-8 times a day to increase milk supply. Plans to have [employer] to house for weight  check.   Follow-Up  Offered Op appointment here but will call and make appointment as needed. Encouraged BFSG as a way to follow weight gain. No questions at present. To call prn    Pamelia Hoit 04/07/2012, 4:51 PM

## 2012-04-15 ENCOUNTER — Inpatient Hospital Stay (HOSPITAL_COMMUNITY): Admission: AD | Admit: 2012-04-15 | Payer: Self-pay | Source: Ambulatory Visit | Admitting: Obstetrics and Gynecology

## 2012-04-17 ENCOUNTER — Ambulatory Visit (INDEPENDENT_AMBULATORY_CARE_PROVIDER_SITE_OTHER): Payer: 59 | Admitting: Emergency Medicine

## 2012-04-17 VITALS — BP 132/83 | HR 76 | Temp 99.0°F | Resp 16 | Ht 67.0 in | Wt 200.0 lb

## 2012-04-17 DIAGNOSIS — L929 Granulomatous disorder of the skin and subcutaneous tissue, unspecified: Secondary | ICD-10-CM

## 2012-04-17 DIAGNOSIS — L918 Other hypertrophic disorders of the skin: Secondary | ICD-10-CM

## 2012-04-17 NOTE — Progress Notes (Signed)
   Date:  04/17/2012   Name:  Melissa [name]   DOB:  1975/08/25   MRN:  [id number] Gender: female Age: 36 y.o.  PCP:  No primary provider on file.    Chief Complaint: Blister   History of Present Illness:  Melissa [name] is a 36 y.o. pleasant patient who presents with the following:  3 weeks post partum.  Developed a blood blister on the lateral aspect of her left third finger just prior to delivery.  Now is enlarged and friable and bleeds with some frequency.  No history of trauma.  There is no problem list on file for this patient.   Past Medical History  Diagnosis Date  . H/O varicella   . Abnormal Pap smear   . Depression   . AMA (advanced maternal age) multigravida 35+   . History of PCOS     Past Surgical History  Procedure Date  . Appendectomy   . Colposcopy     History  Substance Use Topics  . Smoking status: Never Smoker   . Smokeless tobacco: Never Used  . Alcohol Use: No    Family History  Problem Relation Age of Onset  . Thyroid disease Mother   . Diabetes Mother   . Stroke Mother   . Hypertension Father   . Cancer Paternal Grandmother     breast  . Cancer Paternal Grandfather     lung    No Known Allergies  Medication list has been reviewed and updated.  Current Outpatient Prescriptions on File Prior to Visit  Medication Sig Dispense Refill  . Prenatal Vit-Fe Fumarate-FA (PRENATAL MULTIVITAMIN) TABS Take 1 tablet by mouth daily.      Marland Kitchen ibuprofen (ADVIL,MOTRIN) 800 MG tablet Take 800 mg by mouth every 8 (eight) hours as needed. For pain      . oxyCODONE-acetaminophen (PERCOCET/ROXICET) 5-325 MG per tablet Take 1 tablet by mouth every 6 (six) hours as needed. For severe pain        Review of Systems: As per HPI, otherwise negative.    Physical Examination: Filed Vitals:   04/17/12 1437  BP: 132/83  Pulse: 76  Temp: 99 F (37.2 C)  Resp: 16   Filed Vitals:   04/17/12 1437  Height: 5\' 7"  (1.702 m)  Weight: 200 lb (90.719  kg)   Body mass index is 31.32 kg/(m^2). Ideal Body Weight: Weight in (lb) to have BMI = 25: 159.3    GEN: WDWN, NAD, Non-toxic, Alert & Oriented x 3 HEENT: Atraumatic, Normocephalic.  Ears and Nose: No external deformity. EXTR: No clubbing/cyanosis/edema NEURO: Normal gait.  PSYCH: Normally interactive. Conversant. Not depressed or anxious appearing.  Calm demeanor.  Finger:  Friable fungating lesion on a stalk characteristic of granulation tissue. 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

## 2012-04-19 NOTE — Progress Notes (Signed)
Post discharge UR review completed. 

## 2012-04-21 ENCOUNTER — Telehealth: Payer: Self-pay

## 2012-04-21 DIAGNOSIS — L929 Granulomatous disorder of the skin and subcutaneous tissue, unspecified: Secondary | ICD-10-CM

## 2012-04-21 NOTE — Telephone Encounter (Signed)
I have called patient to advise we are referring for this and she will be getting phone call about this.

## 2012-04-21 NOTE — Telephone Encounter (Signed)
Pt saw dr Dareen Piano recently about a spot on her finger and she states that it is still there and would like a referral to [facility] dermatology -she does understand that with requesting specific location and they are scheduling in oct it may be a while  Before appt   Best number [phone]

## 2012-04-21 NOTE — Telephone Encounter (Signed)
Ok to refer.

## 2012-04-21 NOTE — Telephone Encounter (Signed)
Referral made 

## 2012-08-24 NOTE — L&D Delivery Note (Signed)
Delivery Note At 8:52 AM a viable female was delivered via Vaginal, Spontaneous Delivery (Presentation: Left Occiput Anterior).  APGAR: 8, 9; weight .   Placenta status: Intact, Spontaneous.  Cord: 3 vessels with the following complications: None.  Cord pH: na  Anesthesia: Epidural Local xylociane Episiotomy: None Lacerations: 2nd degree Suture Repair: 2.0 chromic Est. Blood Loss (mL): 350  Mom to AICU.  Baby to nursery-stable.  Charina Fons S 04/14/2013, 9:14 AM

## 2012-09-15 LAB — OB RESULTS CONSOLE RUBELLA ANTIBODY, IGM: Rubella: IMMUNE

## 2012-09-15 LAB — OB RESULTS CONSOLE GC/CHLAMYDIA: Chlamydia: NEGATIVE

## 2012-09-15 LAB — OB RESULTS CONSOLE RPR: RPR: NONREACTIVE

## 2012-09-15 LAB — OB RESULTS CONSOLE HEPATITIS B SURFACE ANTIGEN: Hepatitis B Surface Ag: NEGATIVE

## 2013-04-12 ENCOUNTER — Inpatient Hospital Stay (HOSPITAL_COMMUNITY): Admission: AD | Admit: 2013-04-12 | Payer: Self-pay | Source: Ambulatory Visit | Admitting: Obstetrics and Gynecology

## 2013-04-13 ENCOUNTER — Encounter (HOSPITAL_COMMUNITY): Payer: Self-pay

## 2013-04-13 ENCOUNTER — Inpatient Hospital Stay (HOSPITAL_COMMUNITY)
Admission: RE | Admit: 2013-04-13 | Discharge: 2013-04-16 | DRG: 775 | Disposition: A | Discharge status: Home / Self Care | Payer: 59 | Source: Ambulatory Visit | Attending: Obstetrics and Gynecology | Admitting: Obstetrics and Gynecology

## 2013-04-13 ENCOUNTER — Telehealth (HOSPITAL_COMMUNITY): Payer: Self-pay | Admitting: *Deleted

## 2013-04-13 ENCOUNTER — Encounter (HOSPITAL_COMMUNITY): Payer: Self-pay | Admitting: *Deleted

## 2013-04-13 DIAGNOSIS — O480 Post-term pregnancy: Principal | ICD-10-CM | POA: Diagnosis present

## 2013-04-13 DIAGNOSIS — O09529 Supervision of elderly multigravida, unspecified trimester: Secondary | ICD-10-CM | POA: Diagnosis present

## 2013-04-13 LAB — COMPREHENSIVE METABOLIC PANEL
ALT: 10 U/L (ref 0–35)
AST: 15 U/L (ref 0–37)
CO2: 20 mEq/L (ref 19–32)
Calcium: 9.5 mg/dL (ref 8.4–10.5)
Chloride: 102 mEq/L (ref 96–112)
GFR calc Af Amer: >90 mL/min (ref >90)
GFR calc non Af Amer: >90 mL/min (ref >90)
Glucose, Bld: 116 mg/dL — ABNORMAL HIGH (ref 70–99)
Sodium: 135 mEq/L (ref 135–145)
Total Bilirubin: 0.2 mg/dL — ABNORMAL LOW (ref 0.3–1.2)

## 2013-04-13 LAB — CBC
HCT: 34.3 % — ABNORMAL LOW (ref 36.0–46.0)
Hemoglobin: 11.8 g/dL — ABNORMAL LOW (ref 12.0–15.0)
MCH: 31.9 pg (ref 26.0–34.0)
MCHC: 34.4 g/dL (ref 30.0–36.0)
RBC: 3.7 MIL/uL — ABNORMAL LOW (ref 3.87–5.11)

## 2013-04-13 LAB — TYPE AND SCREEN: ABO/RH(D): A POS

## 2013-04-13 MED ORDER — LIDOCAINE HCL (PF) 1 % IJ SOLN
30.0000 mL | INTRAMUSCULAR | Status: DC | PRN
  Filled 2013-04-13 (×2): qty 30

## 2013-04-13 MED ORDER — FENTANYL 2.5 MCG/ML BUPIVACAINE 1/10 % EPIDURAL INFUSION (WH - ANES)
14.0000 mL/h | INTRAMUSCULAR | Status: DC | PRN
  Administered 2013-04-14: 14 mL/h via EPIDURAL
  Filled 2013-04-13: qty 125

## 2013-04-13 MED ORDER — EPHEDRINE 5 MG/ML INJ
10.0000 mg | INTRAVENOUS | Status: DC | PRN
  Filled 2013-04-13: qty 2

## 2013-04-13 MED ORDER — EPHEDRINE 5 MG/ML INJ
10.0000 mg | INTRAVENOUS | Status: DC | PRN
  Filled 2013-04-13: qty 2
  Filled 2013-04-13: qty 4

## 2013-04-13 MED ORDER — ONDANSETRON HCL 4 MG/2ML IJ SOLN: 4.0000 mg | Freq: Four times a day (QID) | INTRAMUSCULAR | Status: DC | PRN

## 2013-04-13 MED ORDER — TERBUTALINE SULFATE 1 MG/ML IJ SOLN: 0.2500 mg | Freq: Once | INTRAMUSCULAR | Status: AC | PRN

## 2013-04-13 MED ORDER — PHENYLEPHRINE 40 MCG/ML (10ML) SYRINGE FOR IV PUSH (FOR BLOOD PRESSURE SUPPORT)
80.0000 ug | PREFILLED_SYRINGE | INTRAVENOUS | Status: DC | PRN
  Filled 2013-04-13: qty 2

## 2013-04-13 MED ORDER — OXYTOCIN 40 UNITS IN LACTATED RINGERS INFUSION - SIMPLE MED: 1.0000 m[IU]/min | INTRAVENOUS | Status: DC

## 2013-04-13 MED ORDER — LACTATED RINGERS IV SOLN
INTRAVENOUS | Status: DC
  Administered 2013-04-13 – 2013-04-14 (×2): via INTRAVENOUS

## 2013-04-13 MED ORDER — ZOLPIDEM TARTRATE 5 MG PO TABS
5.0000 mg | ORAL_TABLET | Freq: Every evening | ORAL | Status: DC | PRN
  Administered 2013-04-13: 5 mg via ORAL
  Filled 2013-04-13: qty 1

## 2013-04-13 MED ORDER — PHENYLEPHRINE 40 MCG/ML (10ML) SYRINGE FOR IV PUSH (FOR BLOOD PRESSURE SUPPORT)
80.0000 ug | PREFILLED_SYRINGE | INTRAVENOUS | Status: DC | PRN
  Filled 2013-04-13: qty 5
  Filled 2013-04-13: qty 2

## 2013-04-13 MED ORDER — FLEET ENEMA 7-19 GM/118ML RE ENEM: 1.0000 | ENEMA | RECTAL | Status: DC | PRN

## 2013-04-13 MED ORDER — MISOPROSTOL 25 MCG QUARTER TABLET
25.0000 ug | ORAL_TABLET | Freq: Once | ORAL | Status: AC
  Administered 2013-04-13: 25 ug via VAGINAL
  Filled 2013-04-13: qty 0.25

## 2013-04-13 MED ORDER — LACTATED RINGERS IV SOLN
500.0000 mL | Freq: Once | INTRAVENOUS | Status: AC
  Administered 2013-04-14: 05:00:00 via INTRAVENOUS

## 2013-04-13 MED ORDER — OXYTOCIN 40 UNITS IN LACTATED RINGERS INFUSION - SIMPLE MED
62.5000 mL/h | INTRAVENOUS | Status: DC
  Filled 2013-04-13: qty 1000

## 2013-04-13 MED ORDER — IBUPROFEN 600 MG PO TABS: 600.0000 mg | ORAL_TABLET | Freq: Four times a day (QID) | ORAL | Status: DC | PRN

## 2013-04-13 MED ORDER — CITRIC ACID-SODIUM CITRATE 334-500 MG/5ML PO SOLN: 30.0000 mL | ORAL | Status: DC | PRN

## 2013-04-13 MED ORDER — ACETAMINOPHEN 325 MG PO TABS: 650.0000 mg | ORAL_TABLET | ORAL | Status: DC | PRN

## 2013-04-13 MED ORDER — DIPHENHYDRAMINE HCL 50 MG/ML IJ SOLN: 12.5000 mg | INTRAMUSCULAR | Status: DC | PRN

## 2013-04-13 MED ORDER — OXYCODONE-ACETAMINOPHEN 5-325 MG PO TABS: 1.0000 | ORAL_TABLET | ORAL | Status: DC | PRN

## 2013-04-13 MED ORDER — OXYTOCIN BOLUS FROM INFUSION: 500.0000 mL | INTRAVENOUS | Status: DC

## 2013-04-13 MED ORDER — BUTORPHANOL TARTRATE 1 MG/ML IJ SOLN: 1.0000 mg | INTRAMUSCULAR | Status: DC | PRN

## 2013-04-13 MED ORDER — LACTATED RINGERS IV SOLN: 500.0000 mL | INTRAVENOUS | Status: DC | PRN

## 2013-04-13 NOTE — Telephone Encounter (Signed)
Preadmission screen  

## 2013-04-14 ENCOUNTER — Inpatient Hospital Stay (HOSPITAL_COMMUNITY): Payer: 59 | Admitting: Anesthesiology

## 2013-04-14 ENCOUNTER — Encounter (HOSPITAL_COMMUNITY): Payer: Self-pay | Admitting: Anesthesiology

## 2013-04-14 ENCOUNTER — Encounter (HOSPITAL_COMMUNITY): Payer: Self-pay

## 2013-04-14 LAB — CBC
HCT: 34.3 % — ABNORMAL LOW (ref 36.0–46.0)
Hemoglobin: 12 g/dL (ref 12.0–15.0)
MCH: 32 pg (ref 26.0–34.0)
MCV: 91.5 fL (ref 78.0–100.0)
RBC: 3.75 MIL/uL — ABNORMAL LOW (ref 3.87–5.11)
WBC: 9.2 10*3/uL (ref 4.0–10.5)

## 2013-04-14 LAB — RPR: RPR Ser Ql: NONREACTIVE

## 2013-04-14 MED ORDER — BISACODYL 10 MG RE SUPP: 10.0000 mg | Freq: Every day | RECTAL | Status: DC | PRN

## 2013-04-14 MED ORDER — ZOLPIDEM TARTRATE 5 MG PO TABS: 5.0000 mg | ORAL_TABLET | Freq: Every evening | ORAL | Status: DC | PRN

## 2013-04-14 MED ORDER — WITCH HAZEL-GLYCERIN EX PADS: 1.0000 "application " | MEDICATED_PAD | CUTANEOUS | Status: DC | PRN

## 2013-04-14 MED ORDER — LANOLIN HYDROUS EX OINT: TOPICAL_OINTMENT | CUTANEOUS | Status: DC | PRN

## 2013-04-14 MED ORDER — FLEET ENEMA 7-19 GM/118ML RE ENEM: 1.0000 | ENEMA | Freq: Every day | RECTAL | Status: DC | PRN

## 2013-04-14 MED ORDER — TETANUS-DIPHTH-ACELL PERTUSSIS 5-2.5-18.5 LF-MCG/0.5 IM SUSP: 0.5000 mL | Freq: Once | INTRAMUSCULAR | Status: DC

## 2013-04-14 MED ORDER — DIBUCAINE 1 % RE OINT: 1.0000 "application " | TOPICAL_OINTMENT | RECTAL | Status: DC | PRN

## 2013-04-14 MED ORDER — DIPHENHYDRAMINE HCL 25 MG PO CAPS: 25.0000 mg | ORAL_CAPSULE | Freq: Four times a day (QID) | ORAL | Status: DC | PRN

## 2013-04-14 MED ORDER — OXYCODONE-ACETAMINOPHEN 5-325 MG PO TABS
1.0000 | ORAL_TABLET | ORAL | Status: DC | PRN
  Administered 2013-04-14: 1 via ORAL
  Filled 2013-04-14: qty 1

## 2013-04-14 MED ORDER — ONDANSETRON HCL 4 MG PO TABS: 4.0000 mg | ORAL_TABLET | ORAL | Status: DC | PRN

## 2013-04-14 MED ORDER — SIMETHICONE 80 MG PO CHEW: 80.0000 mg | CHEWABLE_TABLET | ORAL | Status: DC | PRN

## 2013-04-14 MED ORDER — SODIUM BICARBONATE 8.4 % IV SOLN
INTRAVENOUS | Status: DC | PRN
  Administered 2013-04-14: 5 mL via EPIDURAL

## 2013-04-14 MED ORDER — BENZOCAINE-MENTHOL 20-0.5 % EX AERO
1.0000 "application " | INHALATION_SPRAY | CUTANEOUS | Status: DC | PRN
  Filled 2013-04-14: qty 56

## 2013-04-14 MED ORDER — PRENATAL MULTIVITAMIN CH
1.0000 | ORAL_TABLET | Freq: Every day | ORAL | Status: DC
  Filled 2013-04-14: qty 1

## 2013-04-14 MED ORDER — SENNOSIDES-DOCUSATE SODIUM 8.6-50 MG PO TABS
2.0000 | ORAL_TABLET | Freq: Every day | ORAL | Status: DC
  Administered 2013-04-14 – 2013-04-15 (×2): 2 via ORAL

## 2013-04-14 MED ORDER — ONDANSETRON HCL 4 MG/2ML IJ SOLN: 4.0000 mg | INTRAMUSCULAR | Status: DC | PRN

## 2013-04-14 MED ORDER — IBUPROFEN 600 MG PO TABS
600.0000 mg | ORAL_TABLET | Freq: Four times a day (QID) | ORAL | Status: DC
  Administered 2013-04-14 – 2013-04-16 (×7): 600 mg via ORAL
  Filled 2013-04-14 (×8): qty 1

## 2013-04-14 NOTE — Anesthesia Postprocedure Evaluation (Signed)
Anesthesia Post Note  Patient: Melissa [name]  Procedure(s) Performed: * No procedures listed *  Anesthesia type: Epidural  Patient location: Mother/Baby  Post pain: Pain level controlled  Post assessment: Post-op Vital signs reviewed  Last Vitals:  Filed Vitals:   04/14/13 1600  BP: 131/78  Pulse: 92  Temp: 37.2 C  Resp: 18    Post vital signs: Reviewed  Level of consciousness:alert  Complications: No apparent anesthesia complications

## 2013-04-14 NOTE — H&P (Signed)
Melissa [name] is a 37 y.o. female presenting at 31.3 for induction due to post dates.  Uncomplicate Prenatal course. Maternal Medical History:  Reason for admission: Induction of labor for post dates   Contractions: Frequency: regular.   Perceived severity is moderate.    Fetal activity: Perceived fetal activity is normal.    Prenatal complications: Advanced maternal age negative [id number]  Prenatal Complications - Diabetes: none.    OB History   Grav Para Term Preterm Abortions TAB SAB Ect Mult Living   4 1 1  2  2   1      Past Medical History  Diagnosis Date  . H/O varicella   . Abnormal Pap smear   . Depression   . AMA (advanced maternal age) multigravida 35+   . History of PCOS   . Hx of gestational diabetes in prior pregnancy, currently pregnant    Past Surgical History  Procedure Laterality Date  . Appendectomy    . Colposcopy     Family History: family history includes Cancer in her paternal grandfather and paternal grandmother; Diabetes in her mother; Hypertension in her father; Stroke in her maternal grandmother and mother; Thyroid disease in her mother. Social History:  reports that she has never smoked. She has never used smokeless tobacco. She reports that she does not drink alcohol or use illicit drugs.   Prenatal Transfer Tool  Maternal Diabetes: No Genetic Screening: Normal Maternal Ultrasounds/Referrals: Normal Fetal Ultrasounds or other Referrals:  None Maternal Substance Abuse:  No Significant Maternal Medications:  None Significant Maternal Lab Results:  None Other Comments:  None  ROS  Dilation: 10 Effacement (%): 100 Station: +2 Exam by:: Myalynn Lingle  Blood pressure 134/66, pulse 88, temperature 98.2 F (36.8 C), temperature source Oral, resp. rate 18, height 5' 6.5" (1.689 m), weight 108.863 kg (240 lb), last menstrual period 07/05/2012, SpO2 97.00%, currently breastfeeding. Maternal Exam:  Uterine Assessment: Contraction strength is  moderate.  Contraction frequency is regular.   Abdomen: Patient reports no abdominal tenderness. Fundal height is c/w dates.   Estimated fetal weight is 7-8.   Fetal presentation: vertex  Pelvis: adequate for delivery.   Cervix: Complete dilation vertex at +2 Arom meconium stained  Physical Exam  Prenatal labs: ABO, Rh: --/--/A POS (08/21 2025) Antibody: NEG (08/21 2025) Rubella: Immune (01/23 0000) RPR: NON REACTIVE (08/21 2025)  HBsAg: Negative (01/23 0000)  HIV: Non-reactive (01/23 0000)  GBS: Negative (07/16 0000)   Assessment/Plan: Pregnancy at 40.3 for iduction Routine labor and delivery   Quest Tavenner S 04/14/2013, 8:23 AM

## 2013-04-14 NOTE — Anesthesia Preprocedure Evaluation (Signed)

## 2013-04-14 NOTE — Progress Notes (Signed)
Monitors reapplied after epidural 

## 2013-04-14 NOTE — Anesthesia Procedure Notes (Signed)

## 2013-04-15 LAB — CBC
MCHC: 33.5 g/dL (ref 30.0–36.0)
RDW: 14.3 % (ref 11.5–15.5)

## 2013-04-15 NOTE — Progress Notes (Signed)
Post Partum Day one Subjective: no complaints  Objective: Blood pressure 127/77, pulse 80, temperature 97.6 F (36.4 C), temperature source Oral, resp. rate 16, height 5' 6.5" (1.689 m), weight 108.863 kg (240 lb), last menstrual period 07/05/2012, SpO2 97.00%, currently breastfeeding.  Physical Exam:  General: alert Lochia: appropriate Uterine Fundus: firm Incision: healing well DVT Evaluation: No evidence of DVT seen on physical exam.   Recent Labs  04/14/13 0525 04/15/13 0648  HGB 12.0 11.1*  HCT 34.3* 33.1*    Assessment/Plan: Plan for discharge tomorrow   LOS: 2 days   Advik Weatherspoon S 04/15/2013, 8:18 AM

## 2013-04-16 MED ORDER — OXYCODONE-ACETAMINOPHEN 7.5-325 MG PO TABS: 1.0000 | ORAL_TABLET | ORAL | Status: DC | PRN

## 2013-04-16 NOTE — Progress Notes (Signed)
Clinical Social Work Department BRIEF PSYCHOSOCIAL ASSESSMENT 04/16/2013  Patient:  Melissa [name], Melissa [name]     Account Number:  [id number]     Admit date:  04/13/2013  Clinical Social Worker:  Almeta Monas  Date/Time:  04/16/2013 11:00 AM  Referred by:    Date Referred:    Other Referral:   No referral-NICU admission   Interview type:  Patient Other interview type:    PSYCHOSOCIAL DATA Living Status:  FAMILY Admitted from facility:   Level of care:   Primary support name:  Ellender Hose Primary support relationship to patient:  SPOUSE Degree of support available:   [place] support system of family and friends in the area.    CURRENT CONCERNS Current Concerns  None Noted   Other Concerns:   Hx Depression    SOCIAL WORK ASSESSMENT / PLAN CSW met with MOB in her first floor room as she was preparing for discharge today to introduce myself and complete assessment due to NICU admission.  CSW discussed common emotions related to the NICU experience as well as signs and symptoms of PPD.  CSW discussed MOB's hx of depression and explained ongoing support offered by NICU CSW while baby is in NICU.  MOB was very pleasant and appears to be very aware of her emotions and coping well at this time.  CSW has no social concerns at this time.   Assessment/plan status:  Psychosocial Support/Ongoing Assessment of Needs Other assessment/ plan:   Information/referral to community resources:   No referral needs noted.  MOB is already linked with a counselor in the community.    PATIENT'S/FAMILY'S RESPONSE TO PLAN OF CARE: MOB was very pleasant and appreciative of CSW's visit.  She states she is doing well at this time, but acknowledged the emotional stress of the situation.  She was very receptive to discussing her hx of depression as well as her current feelings with CSW.  She has a Veterinary surgeon who she plans to see if needed.  She states she will resume medication if she feels it is necessary, but  wants to see how she does first and wants to breast feed if possible.  CSW informed her that a Advertising copywriter can discuss safe medications with her so she may continue to breast feed while taking medication if needed.  She stated understanding and is unsure if she wants to breast feed while taking medication, but states she knows it is more important for her to be emotionally stable than for the baby to receive breast milk.  She states no questions or concerns at this time.  She reports having everything she needs for baby at home and having a great support system.

## 2013-04-16 NOTE — Discharge Summary (Signed)
Obstetric Discharge Summary Reason for Admission: induction of labor Prenatal Procedures: none Intrapartum Procedures: spontaneous vaginal delivery Postpartum Procedures: none Complications-Operative and Postpartum: second degree perineal laceration Hemoglobin  Date Value Range Status  04/15/2013 11.1* 12.0 - 15.0 g/dL Final     HCT  Date Value Range Status  04/15/2013 33.1* 36.0 - 46.0 % Final    Physical Exam:  General: alert Lochia: appropriate Uterine Fundus: firm Incision: healing well DVT Evaluation: No evidence of DVT seen on physical exam.  Discharge Diagnoses: Term Pregnancy-delivered  Discharge Information: Date: 04/16/2013 Activity: pelvic rest Diet: routine Medications: PNV and Percocet Condition: stable Instructions: refer to practice specific booklet Discharge to: home   Newborn Data: Live born female  Birth Weight: 10 lb 2.8 oz (4615 g) APGAR: 8, 9  Home with in NICU.  Rickard Kennerly S 04/16/2013, 10:29 AM

## 2013-06-12 ENCOUNTER — Ambulatory Visit (INDEPENDENT_AMBULATORY_CARE_PROVIDER_SITE_OTHER): Payer: 59

## 2013-06-12 ENCOUNTER — Encounter: Payer: Self-pay | Admitting: Podiatry

## 2013-06-12 ENCOUNTER — Ambulatory Visit (INDEPENDENT_AMBULATORY_CARE_PROVIDER_SITE_OTHER): Payer: 59 | Admitting: Podiatry

## 2013-06-12 VITALS — BP 128/86 | HR 77 | Resp 12 | Ht 67.0 in | Wt 185.0 lb

## 2013-06-12 DIAGNOSIS — R52 Pain, unspecified: Secondary | ICD-10-CM

## 2013-06-12 DIAGNOSIS — M779 Enthesopathy, unspecified: Secondary | ICD-10-CM

## 2013-06-12 MED ORDER — DEXAMETHASONE SODIUM PHOSPHATE 4 MG/ML IJ SOLN: 4.0000 mg | Freq: Once | INTRAMUSCULAR | Status: DC

## 2013-06-12 NOTE — Progress Notes (Signed)
  Subjective:    Patient ID: Melissa [name], female    DOB: 28-Dec-1975, 37 y.o.   MRN: [id number]  HPI Comments: RT'' FOOT IS HURTING ON TOP AND OUTSIDE''  Foot Pain  She describes pain on the dorsolateral aspect left foot with walking relieved with rest. She attempts to splint her foot to minimize his symptoms. She did does not recall any direct injury or any significant increase of activity. 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    Review of Systems  Constitutional: Negative.   HENT: Negative.   Eyes: Negative.   Respiratory: Negative.   Cardiovascular: Negative.   Gastrointestinal: Negative.   Endocrine: Negative.   Genitourinary: Negative.   Musculoskeletal: Negative.   Skin: Negative.   Allergic/Immunologic: Negative.   Neurological: Negative.   Hematological: Negative.   Psychiatric/Behavioral: Negative.        Objective:   Physical Exam   Vascular: The DP and PT pulses are two over four bilaterally. Feet are warm to touch bilaterally  Neurological: Knee and ankle reflexes equal and reactive bilaterally.  Dermatologic texture and turgor within normal limits bilaterally  Musculoskeletal: No restriction ankle subtalar midtarsal joints bilaterally. There is palpable tenderness over the sinus tarsi on the left foot. This seems to duplicate her area discomfort.  X-ray examination left foot demonstrates intact bony structure without fracture dislocation noted. Radiographic compression no acute bony abnormality noted.          Assessment & Plan:  Assessment: Sinus tarsi syndrome left foot/capsulitis.  Plan: Skin is prepped with alcohol and Betadine and 4 mg of dexamethasone phosphate and 5 mg of plain Marcaine are injected into the left sinus tarsi. Patient advised to wear a sturdy athletic lace up style shoe and return if his symptoms are not improving in the next 6 weeks.  Richard C.Leeanne Deed, DPM

## 2013-06-12 NOTE — Progress Notes (Signed)
N SORE L  LT FOOT D  2 MONTHS O  SLOWLY C   WORSE A   ROTATION T   NONE

## 2014-06-25 ENCOUNTER — Encounter: Payer: Self-pay | Admitting: Podiatry

## 2015-06-10 ENCOUNTER — Ambulatory Visit (INDEPENDENT_AMBULATORY_CARE_PROVIDER_SITE_OTHER): Payer: 59 | Admitting: Physician Assistant

## 2015-06-10 VITALS — BP 122/72 | HR 80 | Temp 98.6°F | Resp 17 | Ht 67.0 in | Wt 198.0 lb

## 2015-06-10 DIAGNOSIS — J029 Acute pharyngitis, unspecified: Secondary | ICD-10-CM | POA: Diagnosis not present

## 2015-06-10 DIAGNOSIS — J069 Acute upper respiratory infection, unspecified: Secondary | ICD-10-CM

## 2015-06-10 DIAGNOSIS — Z23 Encounter for immunization: Secondary | ICD-10-CM | POA: Diagnosis not present

## 2015-06-10 LAB — POCT RAPID STREP A (OFFICE): RAPID STREP A SCREEN: NEGATIVE

## 2015-06-10 NOTE — Progress Notes (Signed)
[facility] [address], [place] [id number] [phone]  Date:  06/10/2015   Name:  Melissa CarnesStephanie [name]   DOB:  06/03/1976   MRN:  [id number]  PCP:  Leslie AndreaMBLIN II,JAMES E, MD    Chief Complaint: Sore Throat; Laryngitis; and Immunizations   History of Present Illness:  This is a 39 y.o. female who is presenting with sore throat and voice hoarseness x 2 days. States overall she is feeling ok but wanted to make sure she did not have strep.  Cough: mild SOB/wheezing: no Nasal congestion: no Otalgia: no Sore throat: yes. Fever/chills: no Aggravating/alleviating factors: not taking anything History of asthma: no History of env allergies: no Tobacco use: no Has a 38 year old who had some uri sx 1 week ago - resolved. 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 She is able to work from home for the next week.  Review of Systems:  Review of Systems See HPI  Patient Active Problem List   Diagnosis Date Noted  . Spontaneous vaginal delivery 04/14/2013    Class: Status post    Prior to Admission medications   Medication Sig Start Date End Date Taking? Authorizing Provider  FLUoxetine (PROZAC) 20 MG capsule  05/13/13  Yes Historical Provider, MD    No Known Allergies  Past Surgical History  Procedure Laterality Date  . Appendectomy    . Colposcopy      Social History  Substance Use Topics  . Smoking status: Never Smoker   . Smokeless tobacco: Never Used  . Alcohol Use: No    Family History  Problem Relation Age of Onset  . Thyroid disease Mother   . Diabetes Mother   . Stroke Mother   . Hypertension Father   . Cancer Paternal Grandmother     breast  . Cancer Paternal Grandfather     lung  . Stroke Maternal Grandmother     Medication list has been reviewed and updated.  Physical Examination:  Physical Exam  Constitutional: She is oriented to person, place, and time. She appears well-developed and well-nourished. No distress.  HENT:  Head:  Normocephalic and atraumatic.  Right Ear: Hearing, tympanic membrane, external ear and ear canal normal.  Left Ear: Hearing, tympanic membrane, external ear and ear canal normal.  Nose: Nose normal.  Mouth/Throat: Uvula is midline and mucous membranes are normal. Posterior oropharyngeal erythema present. No oropharyngeal exudate or posterior oropharyngeal edema.  Voice hoarse  Eyes: Conjunctivae and lids are normal. Right eye exhibits no discharge. Left eye exhibits no discharge. No scleral icterus.  Cardiovascular: Normal rate, regular rhythm, normal heart sounds and normal pulses.   No murmur heard. Pulmonary/Chest: Effort normal and breath sounds normal. No respiratory distress. She has no wheezes. She has no rhonchi. She has no rales.  Musculoskeletal: Normal range of motion.  Lymphadenopathy:       Head (right side): No submental, no submandibular and no tonsillar adenopathy present.       Head (left side): No submental, no submandibular and no tonsillar adenopathy present.    She has no cervical adenopathy.  Neurological: She is alert and oriented to person, place, and time.  Skin: Skin is warm, dry and intact. No lesion and no rash noted.  Psychiatric: She has a normal mood and affect. Her speech is normal and behavior is normal. Thought content normal.   BP 122/72 mmHg  Pulse 80  Temp(Src) 98.6 F (37 C) (Oral)  Resp 17  Ht 5\' 7"  (  1.702 m)  Wt 198 lb (89.812 kg)  BMI 31.00 kg/m2  SpO2 99%  Results for orders placed or performed in visit on 06/10/15  POCT rapid strep A  Result Value Ref Range   Rapid Strep A Screen Negative Negative    Assessment and Plan:  1. Viral URI 2. Sore throat Rapid strep negative, culture pending. Etiology likely viral, discussed supportive care measures. Return in 1 week if sx not improved. - POCT rapid strep A - Culture, Group A Strep  3. Need for prophylactic vaccination and inoculation against influenza - Flu Vaccine QUAD 36+ mos  IM   [place] Miners. Dyke Brackett, MHS [facility]  06/10/2015

## 2015-06-10 NOTE — Patient Instructions (Signed)
Drink plenty of water (64 oz/day) and get plenty of rest. Ibuprofen/tylenol for pain. Hot tea and cough drops can help. Rest your voice.  If your symptoms are not improving in 7 days, return to clinic.

## 2015-06-12 LAB — CULTURE, GROUP A STREP: Organism ID, Bacteria: NORMAL

## 2018-05-05 ENCOUNTER — Ambulatory Visit (HOSPITAL_COMMUNITY)
Admission: EM | Admit: 2018-05-05 | Discharge: 2018-05-05 | Disposition: A | Discharge status: Home / Self Care | Payer: 59 | Attending: Family Medicine | Admitting: Family Medicine

## 2018-05-05 ENCOUNTER — Encounter (HOSPITAL_COMMUNITY): Payer: Self-pay

## 2018-05-05 DIAGNOSIS — M25562 Pain in left knee: Secondary | ICD-10-CM | POA: Diagnosis not present

## 2018-05-05 MED ORDER — NAPROXEN 500 MG PO TABS: 500.0000 mg | ORAL_TABLET | Freq: Two times a day (BID) | ORAL | 0 refills | Status: DC

## 2018-05-05 NOTE — Discharge Instructions (Signed)
Activity as tolerated, see knee exercises as able.  Naproxen twice a day.  Ice application, especially after activity.  If symptoms worsen or do not improve in the next week to return to be seen or to follow up with sports medicine or your primary care provider

## 2018-05-05 NOTE — ED Triage Notes (Signed)
Pt presents with left leg pain and discomfort not related to any injury; has a family history of blood clots.  Pt also complains of iron being low and having a fever unassociated with anything in particular.

## 2018-05-05 NOTE — ED Provider Notes (Signed)
[facility]    CSN: [id number] Arrival date & time: 05/05/18  [id number]     History   Chief Complaint Chief Complaint  Patient presents with  . Leg Pain    HPI Melissa [name] is a 42 y.o. female.   Melissa [name] presents with complaints of left knee pain which started approximately 1 week ago, worse with activity as well as at night. Noted an area to her anterior knee which feels like a bruise although she does not recall a specific event to cause. At times her calf feels tight. States her mom had a stroke related to a blood clot from a hole in her heart, is on coumadin. Also states that she was found to have too low of iron earlier this week to give blood at a blood drive. No dizziness, headache, weakness, blood in stool, blood in urine. No personal history of blood clot, no recent travel or hospitalization, doesn't smoke, she has an IUD, does not have periods, no chest pain or shortness of breath. She has not taken any medications for pain, rates it 6/10. No previous knee injury.    ROS per HPI.      Past Medical History:  Diagnosis Date  . Abnormal Pap smear   . AMA (advanced maternal age) multigravida 35+   . Depression   . H/O varicella   . History of PCOS   . Hx of gestational diabetes in prior pregnancy, currently pregnant     Patient Active Problem List   Diagnosis Date Noted  . Spontaneous vaginal delivery 04/14/2013    Class: Status post    Past Surgical History:  Procedure Laterality Date  . APPENDECTOMY    . COLPOSCOPY      OB History    Gravida  4   Para  2   Term  2   Preterm      AB  2   Living  2     SAB  2   TAB      Ectopic      Multiple      Live Births  2            Home Medications    Prior to Admission medications   Medication Sig Start Date End Date Taking? Authorizing Provider  FLUoxetine (PROZAC) 20 MG capsule  05/13/13   [provider]  naproxen (NAPROSYN) 500 MG tablet Take 1 tablet (500 mg  total) by mouth 2 (two) times daily. 05/05/18   Georgetta Haber, NP    Family History Family History  Problem Relation Age of Onset  . Stroke Maternal Grandmother   . Thyroid disease Mother   . Diabetes Mother   . Stroke Mother   . Hypertension Father   . Cancer Paternal Grandmother        breast  . Cancer Paternal Grandfather        lung    Social History Social History   Tobacco Use  . Smoking status: Never Smoker  . Smokeless tobacco: Never Used  Substance Use Topics  . Alcohol use: No  . Drug use: No     Allergies   Patient has no known allergies.   Review of Systems Review of Systems   Physical Exam Triage Vital Signs ED Triage Vitals [05/05/18 0823]  Enc Vitals Group     BP (!) 154/96     Pulse Rate 92     Resp 20     Temp 99.5 F (37.5  C)     Temp Source Oral     SpO2 100 %     Weight      Height      Head Circumference      Peak Flow      Pain Score      Pain Loc      Pain Edu?      Excl. in GC?    No data found.  Updated Vital Signs BP (!) 154/96 (BP Location: Left Arm)   Pulse 92   Temp 99.5 F (37.5 C) (Oral)   Resp 20   SpO2 100%   Physical Exam  Constitutional: She is oriented to person, place, and time. She appears well-developed and well-nourished. No distress.  Cardiovascular: Normal rate, regular rhythm and normal heart sounds.  Pulmonary/Chest: Effort normal and breath sounds normal.  Musculoskeletal:       Left knee: She exhibits ecchymosis. She exhibits normal range of motion, no swelling, no effusion, no deformity, no laceration, no erythema, normal alignment, no LCL laxity, normal patellar mobility, no bony tenderness, normal meniscus and no MCL laxity. Tenderness found. Patellar tendon tenderness noted.       Left lower leg: Normal.       Legs: Shin musculature with mild tenderness; proximal calf with subjective mild tenderness; calf is soft, without redness swelling or bruising; anterior knee with approximately 2 cm in  diameter bruise, tender; no swelling or redness; no warmth; mild patellar tendon tenderness on palpation and mild posterior knee tenderness on palpation and with knee flexion; no palpable cyst; full ROM of knee; no laxity and negative anterior drawer. strength equal bilaterally; gross sensation intact; ambulatory without difficulty   Neurological: She is alert and oriented to person, place, and time.  Skin: Skin is warm and dry.     UC Treatments / Results  Labs (all labs ordered are listed, but only abnormal results are displayed) Labs Reviewed - No data to display  EKG None  Radiology No results found.  Procedures Procedures (including critical care time)  Medications Ordered in UC Medications - No data to display  Initial Impression / Assessment and Plan / UC Course  I have reviewed the triage vital signs and the nursing notes.  Pertinent labs & imaging results that were available during my care of the patient were reviewed by me and considered in my medical decision making (see chart for details).     Patient states she was concerned about possible blood clot especially as her iron was low 4 days ago and couldn't give blood. No obvious bleeding source, asymptomatic, in 2014 noted to have H&H of 11.1 and 33.1. Left knee with bruise present, no redness, warmth swelling, calf is soft and without redness warmth or swelling. No risk factors for blood clot. Question some meniscal strain or inflammation causing posterior knee pain. Supportive cares recommended at this time. Follow up with PCP and/or sports medicine as needed. Patient verbalized understanding and agreeable to plan.  Ambulatory out of clinic without difficulty.     Final Clinical Impressions(s) / UC Diagnoses   Final diagnoses:  Acute pain of left knee     Discharge Instructions     Activity as tolerated, see knee exercises as able.  Naproxen twice a day.  Ice application, especially after activity.  If  symptoms worsen or do not improve in the next week to return to be seen or to follow up with sports medicine or your primary care provider  ED Prescriptions    Medication Sig Dispense Auth. Provider   naproxen (NAPROSYN) 500 MG tablet Take 1 tablet (500 mg total) by mouth 2 (two) times daily. 30 tablet Georgetta Haber, NP     Controlled Substance Prescriptions [place] Controlled Substance Registry consulted? Not Applicable   Georgetta Haber, NP 05/05/18 [phone]

## 2019-03-16 ENCOUNTER — Other Ambulatory Visit: Payer: Self-pay | Admitting: Obstetrics and Gynecology

## 2019-03-16 DIAGNOSIS — R928 Other abnormal and inconclusive findings on diagnostic imaging of breast: Secondary | ICD-10-CM

## 2019-03-22 ENCOUNTER — Other Ambulatory Visit: Payer: Self-pay

## 2019-03-22 ENCOUNTER — Ambulatory Visit
Admission: RE | Admit: 2019-03-22 | Discharge: 2019-03-22 | Disposition: A | Discharge status: Home / Self Care | Payer: 59 | Source: Ambulatory Visit | Attending: Obstetrics and Gynecology | Admitting: Obstetrics and Gynecology

## 2019-03-22 ENCOUNTER — Other Ambulatory Visit: Payer: Self-pay | Admitting: Obstetrics and Gynecology

## 2019-03-22 DIAGNOSIS — R928 Other abnormal and inconclusive findings on diagnostic imaging of breast: Secondary | ICD-10-CM

## 2019-03-22 DIAGNOSIS — N6320 Unspecified lump in the left breast, unspecified quadrant: Secondary | ICD-10-CM

## 2019-03-23 ENCOUNTER — Ambulatory Visit
Admission: RE | Admit: 2019-03-23 | Discharge: 2019-03-23 | Disposition: A | Discharge status: Home / Self Care | Payer: 59 | Source: Ambulatory Visit | Attending: Obstetrics and Gynecology | Admitting: Obstetrics and Gynecology

## 2019-03-23 ENCOUNTER — Other Ambulatory Visit: Payer: Self-pay

## 2019-03-23 DIAGNOSIS — N6320 Unspecified lump in the left breast, unspecified quadrant: Secondary | ICD-10-CM

## 2019-08-31 ENCOUNTER — Ambulatory Visit: Payer: 59 | Attending: Internal Medicine

## 2019-08-31 DIAGNOSIS — Z20822 Contact with and (suspected) exposure to covid-19: Secondary | ICD-10-CM

## 2019-09-02 LAB — NOVEL CORONAVIRUS, NAA: SARS-CoV-2, NAA: DETECTED — AB

## 2019-12-07 ENCOUNTER — Ambulatory Visit: Payer: 59 | Attending: Internal Medicine

## 2019-12-07 DIAGNOSIS — Z23 Encounter for immunization: Secondary | ICD-10-CM

## 2019-12-07 NOTE — Progress Notes (Signed)
   Covid-19 Vaccination Clinic  Name:  Melissa [name]    MRN: [id number] DOB: 06-06-1976  12/07/2019  Melissa [name] was observed post Covid-19 immunization for 15 minutes without incident. She was provided with Vaccine Information Sheet and instruction to access the V-Safe system.   Melissa [name] was instructed to call 911 with any severe reactions post vaccine: Marland Kitchen Difficulty breathing  . Swelling of face and throat  . A fast heartbeat  . A bad rash all over body  . Dizziness and weakness   Immunizations Administered    Name Date Dose VIS Date Route   Pfizer COVID-19 Vaccine 12/07/2019 12:02 PM 0.3 mL 08/04/2019 Intramuscular   Manufacturer: [employer], [employer]   Lot: [id number]   NDC: [address]-1

## 2020-01-01 ENCOUNTER — Ambulatory Visit: Payer: 59 | Attending: Internal Medicine

## 2020-01-01 DIAGNOSIS — Z23 Encounter for immunization: Secondary | ICD-10-CM

## 2020-01-01 NOTE — Progress Notes (Signed)
   Covid-19 Vaccination Clinic  Name:  Melissa [name]    MRN: [id number] DOB: Dec 22, 1975  01/01/2020  [name] was observed post Covid-19 immunization for 15 minutes without incident. She was provided with Vaccine Information Sheet and instruction to access the V-Safe system.   [name] was instructed to call 911 with any severe reactions post vaccine: Marland Kitchen Difficulty breathing  . Swelling of face and throat  . A fast heartbeat  . A bad rash all over body  . Dizziness and weakness   Immunizations Administered    Name Date Dose VIS Date Route   Pfizer COVID-19 Vaccine 01/01/2020  9:05 AM 0.3 mL 10/18/2018 Intramuscular   Manufacturer: [employer], [employer]   Lot: [id number]   NDC: [address]-4

## 2020-09-19 IMAGING — MG MM CLIP PLACEMENT
4 series · 4 of 12 positions shown · non-contrast
Comparison: Previous exam(s).

CLINICAL DATA: Post biopsy mammogram of the left breast for clip
placement.

EXAM:
DIAGNOSTIC LEFT MAMMOGRAM POST ULTRASOUND BIOPSY

[L ML synth-2D]
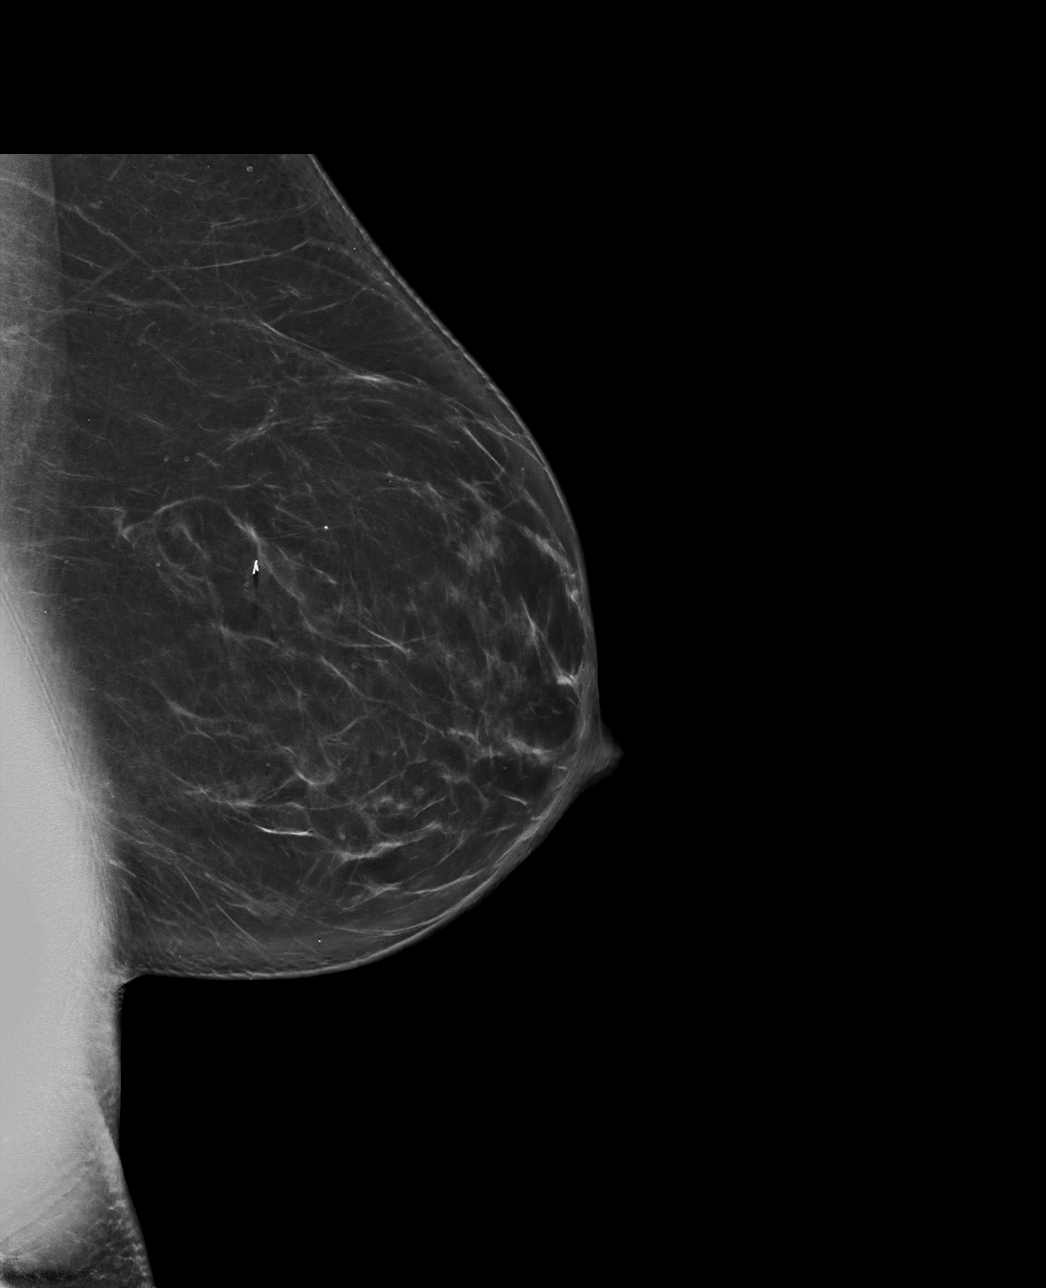

[L CC synth-2D]
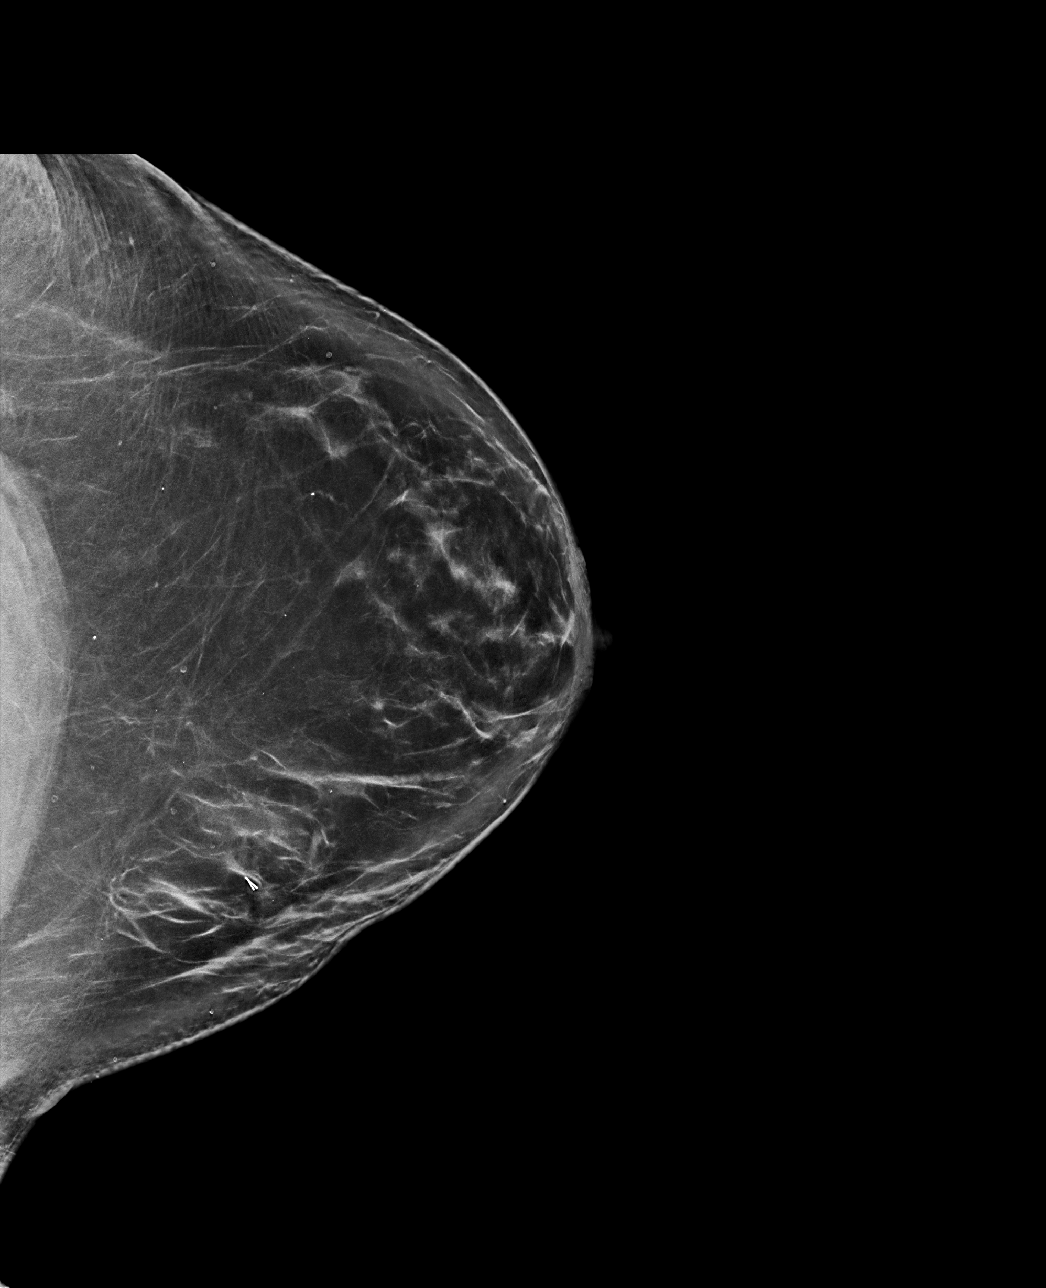

[L CC tomo · tomo slice 47/94.0]
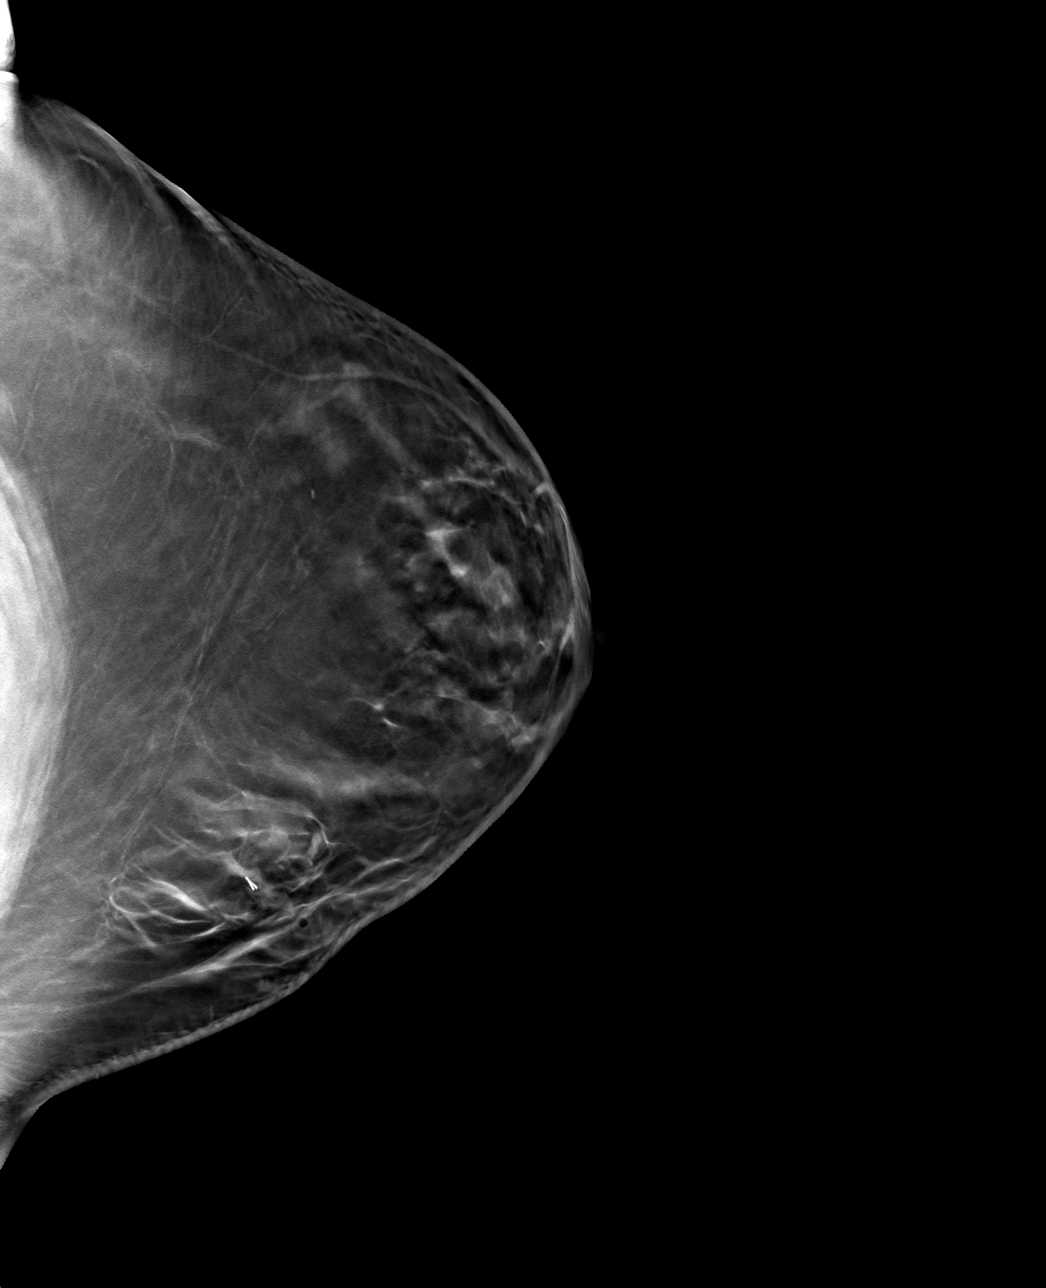

[L ML tomo · tomo slice 43/84.0]
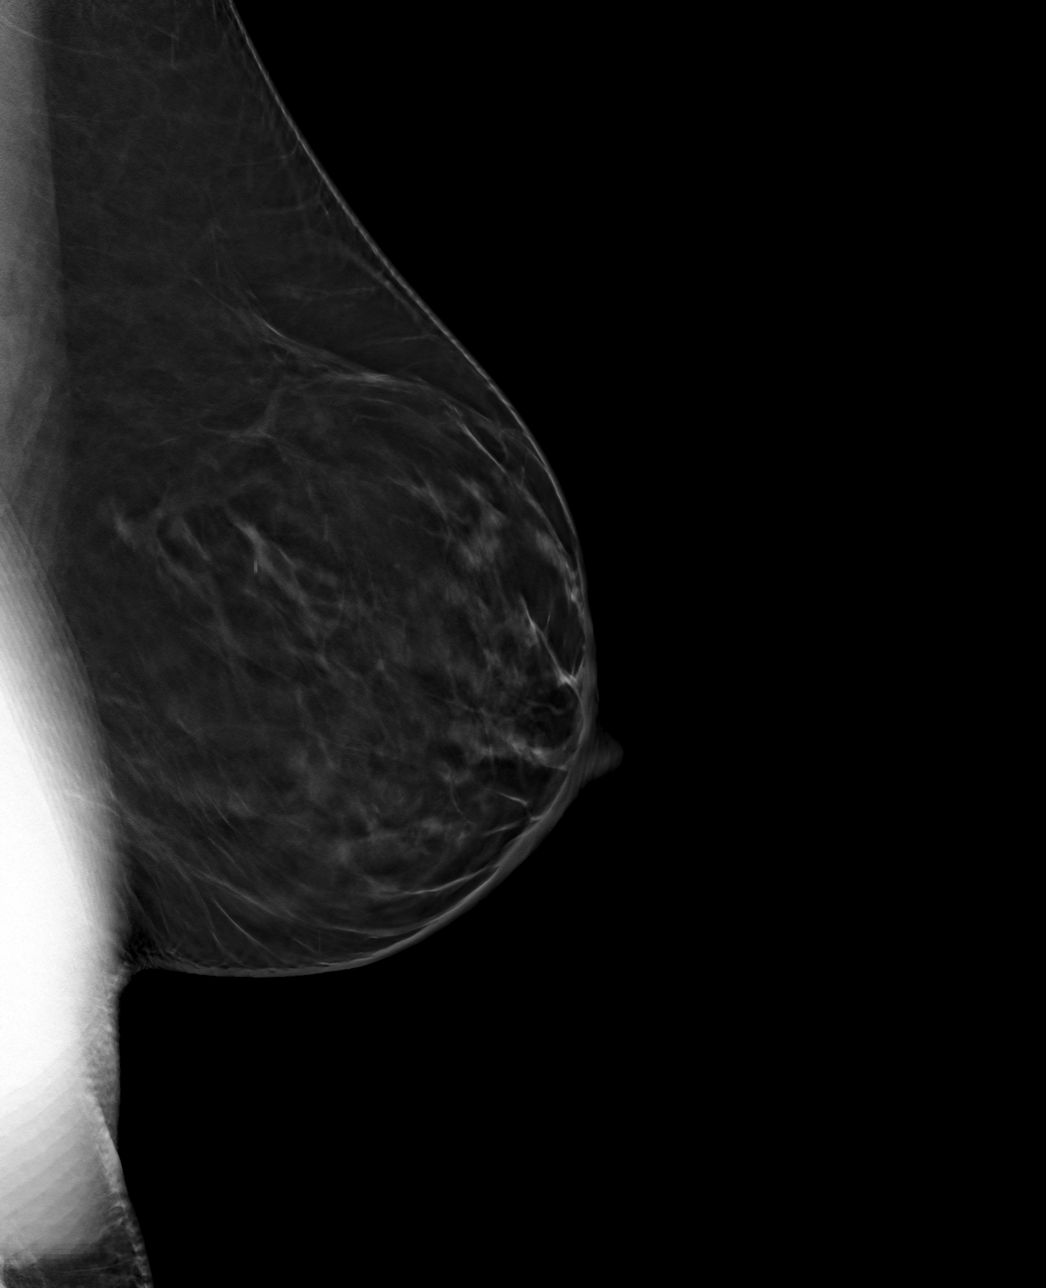

[4 of 12 positions shown; findings below may reference images not displayed]

FINDINGS: Mammographic images were obtained following ultrasound guided biopsy
of a mass in the left breast at [DATE]. The ribbon shaped biopsy
marking clip is well positioned at the site of biopsy in the upper
inner left breast.
IMPRESSION: Appropriate positioning of the ribbon shaped biopsy marking clip in
the upper inner left breast.

Final Assessment: Post Procedure Mammograms for Marker Placement

## 2021-01-16 DIAGNOSIS — F411 Generalized anxiety disorder: Secondary | ICD-10-CM | POA: Diagnosis not present

## 2021-01-16 DIAGNOSIS — F3341 Major depressive disorder, recurrent, in partial remission: Secondary | ICD-10-CM | POA: Diagnosis not present

## 2021-02-18 DIAGNOSIS — F411 Generalized anxiety disorder: Secondary | ICD-10-CM | POA: Diagnosis not present

## 2021-02-18 DIAGNOSIS — F3341 Major depressive disorder, recurrent, in partial remission: Secondary | ICD-10-CM | POA: Diagnosis not present

## 2021-02-25 DIAGNOSIS — E282 Polycystic ovarian syndrome: Secondary | ICD-10-CM | POA: Diagnosis not present

## 2021-02-25 DIAGNOSIS — E559 Vitamin D deficiency, unspecified: Secondary | ICD-10-CM | POA: Diagnosis not present

## 2021-02-25 DIAGNOSIS — Z0000 Encounter for general adult medical examination without abnormal findings: Secondary | ICD-10-CM | POA: Diagnosis not present

## 2021-02-26 DIAGNOSIS — F411 Generalized anxiety disorder: Secondary | ICD-10-CM | POA: Diagnosis not present

## 2021-02-26 DIAGNOSIS — F3341 Major depressive disorder, recurrent, in partial remission: Secondary | ICD-10-CM | POA: Diagnosis not present

## 2021-03-25 DIAGNOSIS — F3341 Major depressive disorder, recurrent, in partial remission: Secondary | ICD-10-CM | POA: Diagnosis not present

## 2021-03-25 DIAGNOSIS — F411 Generalized anxiety disorder: Secondary | ICD-10-CM | POA: Diagnosis not present

## 2021-04-10 DIAGNOSIS — Z01419 Encounter for gynecological examination (general) (routine) without abnormal findings: Secondary | ICD-10-CM | POA: Diagnosis not present

## 2021-04-10 DIAGNOSIS — Z6831 Body mass index (BMI) 31.0-31.9, adult: Secondary | ICD-10-CM | POA: Diagnosis not present

## 2021-04-24 DIAGNOSIS — F3341 Major depressive disorder, recurrent, in partial remission: Secondary | ICD-10-CM | POA: Diagnosis not present

## 2021-04-24 DIAGNOSIS — F411 Generalized anxiety disorder: Secondary | ICD-10-CM | POA: Diagnosis not present

## 2021-05-01 DIAGNOSIS — F3341 Major depressive disorder, recurrent, in partial remission: Secondary | ICD-10-CM | POA: Diagnosis not present

## 2021-05-01 DIAGNOSIS — F411 Generalized anxiety disorder: Secondary | ICD-10-CM | POA: Diagnosis not present

## 2021-10-27 DIAGNOSIS — F411 Generalized anxiety disorder: Secondary | ICD-10-CM | POA: Diagnosis not present

## 2021-10-27 DIAGNOSIS — F3341 Major depressive disorder, recurrent, in partial remission: Secondary | ICD-10-CM | POA: Diagnosis not present

## 2023-10-01 ENCOUNTER — Other Ambulatory Visit: Payer: Self-pay | Admitting: Obstetrics and Gynecology

## 2023-10-01 ENCOUNTER — Encounter: Payer: Self-pay | Admitting: Obstetrics and Gynecology

## 2023-10-01 DIAGNOSIS — N644 Mastodynia: Secondary | ICD-10-CM

## 2023-10-13 ENCOUNTER — Other Ambulatory Visit: Payer: Self-pay | Admitting: Obstetrics and Gynecology

## 2023-10-13 ENCOUNTER — Ambulatory Visit
Admission: RE | Admit: 2023-10-13 | Discharge: 2023-10-13 | Discharge status: Home / Self Care | Payer: 59 | Source: Ambulatory Visit | Attending: Obstetrics and Gynecology | Admitting: Obstetrics and Gynecology

## 2023-10-13 DIAGNOSIS — N644 Mastodynia: Secondary | ICD-10-CM

## 2023-10-13 DIAGNOSIS — N6320 Unspecified lump in the left breast, unspecified quadrant: Secondary | ICD-10-CM

## 2023-10-14 ENCOUNTER — Other Ambulatory Visit: Payer: 59

## 2023-10-18 ENCOUNTER — Ambulatory Visit
Admission: RE | Admit: 2023-10-18 | Discharge: 2023-10-18 | Disposition: A | Discharge status: Home / Self Care | Payer: BC Managed Care – PPO | Source: Ambulatory Visit | Attending: Obstetrics and Gynecology | Admitting: Obstetrics and Gynecology

## 2023-10-18 ENCOUNTER — Other Ambulatory Visit: Payer: Self-pay | Admitting: Diagnostic Radiology

## 2023-10-18 DIAGNOSIS — N6320 Unspecified lump in the left breast, unspecified quadrant: Secondary | ICD-10-CM

## 2023-10-18 HISTORY — PX: BREAST BIOPSY: SHX20

## 2023-10-19 LAB — SURGICAL PATHOLOGY

## 2023-10-25 ENCOUNTER — Emergency Department (HOSPITAL_COMMUNITY)

## 2023-10-25 ENCOUNTER — Other Ambulatory Visit: Payer: Self-pay

## 2023-10-25 ENCOUNTER — Emergency Department (HOSPITAL_COMMUNITY)
Admission: EM | Admit: 2023-10-25 | Discharge: 2023-10-25 | Disposition: A | Discharge status: Home / Self Care | Attending: Emergency Medicine | Admitting: Emergency Medicine

## 2023-10-25 ENCOUNTER — Encounter (HOSPITAL_COMMUNITY): Payer: Self-pay

## 2023-10-25 DIAGNOSIS — R7401 Elevation of levels of liver transaminase levels: Secondary | ICD-10-CM | POA: Diagnosis not present

## 2023-10-25 DIAGNOSIS — R200 Anesthesia of skin: Secondary | ICD-10-CM | POA: Diagnosis present

## 2023-10-25 DIAGNOSIS — Y908 Blood alcohol level of 240 mg/100 ml or more: Secondary | ICD-10-CM | POA: Diagnosis not present

## 2023-10-25 DIAGNOSIS — R519 Headache, unspecified: Secondary | ICD-10-CM | POA: Diagnosis not present

## 2023-10-25 DIAGNOSIS — F10120 Alcohol abuse with intoxication, uncomplicated: Secondary | ICD-10-CM | POA: Insufficient documentation

## 2023-10-25 DIAGNOSIS — F10920 Alcohol use, unspecified with intoxication, uncomplicated: Secondary | ICD-10-CM

## 2023-10-25 LAB — CBG MONITORING, ED: Glucose-Capillary: 122 mg/dL — ABNORMAL HIGH (ref 70–99)

## 2023-10-25 LAB — COMPREHENSIVE METABOLIC PANEL
ALT: 97 U/L — ABNORMAL HIGH (ref 0–44)
AST: 141 U/L — ABNORMAL HIGH (ref 15–41)
Albumin: 4.4 g/dL (ref 3.5–5.0)
Alkaline Phosphatase: 40 U/L (ref 38–126)
Anion gap: 14 (ref 5–15)
BUN: <5 mg/dL — ABNORMAL LOW (ref 6–20)
CO2: 22 mmol/L (ref 22–32)
Calcium: 9.3 mg/dL (ref 8.9–10.3)
Chloride: 102 mmol/L (ref 98–111)
Creatinine, Ser: 0.63 mg/dL (ref 0.44–1.00)
GFR, Estimated: >60 mL/min (ref >60)
Glucose, Bld: 138 mg/dL — ABNORMAL HIGH (ref 70–99)
Potassium: 3.8 mmol/L (ref 3.5–5.1)
Sodium: 138 mmol/L (ref 135–145)
Total Bilirubin: 0.9 mg/dL (ref 0.0–1.2)
Total Protein: 7.4 g/dL (ref 6.5–8.1)

## 2023-10-25 LAB — I-STAT CHEM 8, ED
BUN: 4 mg/dL — ABNORMAL LOW (ref 6–20)
Calcium, Ion: 1.09 mmol/L — ABNORMAL LOW (ref 1.15–1.40)
Chloride: 106 mmol/L (ref 98–111)
Creatinine, Ser: 0.9 mg/dL (ref 0.44–1.00)
Glucose, Bld: 134 mg/dL — ABNORMAL HIGH (ref 70–99)
HCT: 40 % (ref 36.0–46.0)
Hemoglobin: 13.6 g/dL (ref 12.0–15.0)
Potassium: 3.9 mmol/L (ref 3.5–5.1)
Sodium: 140 mmol/L (ref 135–145)
TCO2: 21 mmol/L — ABNORMAL LOW (ref 22–32)

## 2023-10-25 LAB — DIFFERENTIAL
Abs Immature Granulocytes: 0.01 10*3/uL (ref 0.00–0.07)
Basophils Absolute: 0 10*3/uL (ref 0.0–0.1)
Basophils Relative: 1 %
Eosinophils Absolute: 0.1 10*3/uL (ref 0.0–0.5)
Eosinophils Relative: 2 %
Immature Granulocytes: 0 %
Lymphocytes Relative: 42 %
Lymphs Abs: 1.9 10*3/uL (ref 0.7–4.0)
Monocytes Absolute: 0.4 10*3/uL (ref 0.1–1.0)
Monocytes Relative: 9 %
Neutro Abs: 2.1 10*3/uL (ref 1.7–7.7)
Neutrophils Relative %: 46 %

## 2023-10-25 LAB — CBC
HCT: 40.5 % (ref 36.0–46.0)
Hemoglobin: 13.7 g/dL (ref 12.0–15.0)
MCH: 33.8 pg (ref 26.0–34.0)
MCHC: 33.8 g/dL (ref 30.0–36.0)
MCV: 100 fL (ref 80.0–100.0)
Platelets: 172 10*3/uL (ref 150–400)
RBC: 4.05 MIL/uL (ref 3.87–5.11)
RDW: 12.5 % (ref 11.5–15.5)
WBC: 4.5 10*3/uL (ref 4.0–10.5)
nRBC: 0 % (ref 0.0–0.2)

## 2023-10-25 LAB — APTT: aPTT: 29 s (ref 24–36)

## 2023-10-25 LAB — ETHANOL: Alcohol, Ethyl (B): 257 mg/dL — ABNORMAL HIGH (ref <10)

## 2023-10-25 LAB — PROTIME-INR
INR: 1.1 (ref 0.8–1.2)
Prothrombin Time: 14.2 s (ref 11.4–15.2)

## 2023-10-25 LAB — HCG, SERUM, QUALITATIVE: Preg, Serum: NEGATIVE

## 2023-10-25 MED ORDER — DIPHENHYDRAMINE HCL 50 MG/ML IJ SOLN
12.5000 mg | Freq: Once | INTRAMUSCULAR | Status: AC
  Administered 2023-10-25: 12.5 mg via INTRAVENOUS
  Filled 2023-10-25: qty 1

## 2023-10-25 MED ORDER — PANTOPRAZOLE SODIUM 20 MG PO TBEC: 20.0000 mg | DELAYED_RELEASE_TABLET | Freq: Every day | ORAL | 0 refills | Status: AC

## 2023-10-25 MED ORDER — SODIUM CHLORIDE 0.9 % IV BOLUS
1000.0000 mL | Freq: Once | INTRAVENOUS | Status: AC
  Administered 2023-10-25: 1000 mL via INTRAVENOUS

## 2023-10-25 MED ORDER — CHLORDIAZEPOXIDE HCL 25 MG PO CAPS: ORAL_CAPSULE | ORAL | 0 refills | Status: DC

## 2023-10-25 MED ORDER — METOCLOPRAMIDE HCL 5 MG/ML IJ SOLN
10.0000 mg | Freq: Once | INTRAMUSCULAR | Status: AC
  Administered 2023-10-25: 10 mg via INTRAVENOUS
  Filled 2023-10-25: qty 2

## 2023-10-25 MED ORDER — KETOROLAC TROMETHAMINE 30 MG/ML IJ SOLN
30.0000 mg | Freq: Once | INTRAMUSCULAR | Status: AC
  Administered 2023-10-25: 30 mg via INTRAVENOUS
  Filled 2023-10-25: qty 1

## 2023-10-25 NOTE — ED Provider Notes (Signed)
 [place] EMERGENCY DEPARTMENT AT [facility] Provider Note   CSN: [id number] Arrival date & time: 10/25/23  4098     History  Chief Complaint  Patient presents with   Lt Side numbness   Difficulty Swallowing     Melissa [name] is a 48 y.o. female.  Pt is a 48 yo female with pmhx significant for depression.  Pt had a breast biopsy a few days ago and has been worried about that.  She also has some numbness to her left arm and leg and some left facial pressure.  She also noticed some difficulty swallowing.  She has been able to ambulate.  Sx have been going on for several days.       Home Medications Prior to Admission medications   Medication Sig Start Date End Date Taking? Authorizing Provider  chlordiazePOXIDE (LIBRIUM) 25 MG capsule 50mg  PO TID x 1D, then 25-50mg  PO BID X 1D, then 25-50mg  PO QD X 1D 10/25/23  Yes Jacalyn Lefevre, MD  ibuprofen (ADVIL) 200 MG tablet Take 400 mg by mouth 2 (two) times daily as needed for mild pain (pain score 1-3) or moderate pain (pain score 4-6).   Yes [provider]  pantoprazole (PROTONIX) 20 MG tablet Take 1 tablet (20 mg total) by mouth daily. 10/25/23  Yes Jacalyn Lefevre, MD      Allergies    Patient has no known allergies.    Review of Systems   Review of Systems  HENT:  Positive for sinus pressure.   Neurological:  Positive for numbness.       Trouble swallowing  All other systems reviewed and are negative.   Physical Exam Updated Vital Signs BP (!) 165/98   Pulse 95   Temp 98.7 F (37.1 C) (Oral)   Resp 15   Ht 5\' 7"  (1.702 m)   Wt 89.8 kg   SpO2 100%   BMI 31.01 kg/m  Physical Exam Vitals and nursing note reviewed.  Constitutional:      Appearance: Normal appearance.  HENT:     Head: Normocephalic and atraumatic.     Right Ear: External ear normal.     Left Ear: External ear normal.     Nose: Nose normal.     Mouth/Throat:     Mouth: Mucous membranes are dry.  Eyes:     Extraocular  Movements: Extraocular movements intact.     Conjunctiva/sclera: Conjunctivae normal.     Pupils: Pupils are equal, round, and reactive to light.  Cardiovascular:     Rate and Rhythm: Normal rate and regular rhythm.     Pulses: Normal pulses.     Heart sounds: Normal heart sounds.  Pulmonary:     Effort: Pulmonary effort is normal.     Breath sounds: Normal breath sounds.  Abdominal:     General: Abdomen is flat. Bowel sounds are normal.     Palpations: Abdomen is soft.  Musculoskeletal:        General: Normal range of motion.     Cervical back: Normal range of motion and neck supple.  Skin:    General: Skin is warm.     Capillary Refill: Capillary refill takes less than 2 seconds.  Neurological:     General: No focal deficit present.     Mental Status: She is alert and oriented to person, place, and time.  Psychiatric:        Mood and Affect: Mood normal.        Behavior:  Behavior normal.     ED Results / Procedures / Treatments   Labs (all labs ordered are listed, but only abnormal results are displayed) Labs Reviewed  COMPREHENSIVE METABOLIC PANEL - Abnormal; Notable for the following components:      Result Value   Glucose, Bld 138 (*)    BUN <5 (*)    AST 141 (*)    ALT 97 (*)    All other components within normal limits  ETHANOL - Abnormal; Notable for the following components:   Alcohol, Ethyl (B) 257 (*)    All other components within normal limits  I-STAT CHEM 8, ED - Abnormal; Notable for the following components:   BUN 4 (*)    Glucose, Bld 134 (*)    Calcium, Ion 1.09 (*)    TCO2 21 (*)    All other components within normal limits  CBG MONITORING, ED - Abnormal; Notable for the following components:   Glucose-Capillary 122 (*)    All other components within normal limits  PROTIME-INR  APTT  CBC  DIFFERENTIAL  HCG, SERUM, QUALITATIVE    EKG EKG Interpretation Date/Time:  Monday October 25 2023 09:39:10 EST Ventricular Rate:  94 PR  Interval:  156 QRS Duration:  88 QT Interval:  334 QTC Calculation: 417 R Axis:   52  Text Interpretation: Normal sinus rhythm Cannot rule out Anterior infarct , age undetermined Abnormal ECG No previous ECGs available Confirmed by Jacalyn Lefevre [phone]) on 10/25/2023 11:35:36 AM  Radiology MR BRAIN WO CONTRAST Result Date: 10/25/2023 CLINICAL DATA:  Left arm numbness, concern for stroke. EXAM: MRI HEAD WITHOUT CONTRAST TECHNIQUE: Multiplanar, multiecho pulse sequences of the brain and surrounding structures were obtained without intravenous contrast. COMPARISON:  None Available. FINDINGS: Brain: No acute infarct. No evidence of intracranial hemorrhage. White matter is unremarkable. No mass lesion or midline shift. Cerebellum is unremarkable. Normal appearance of midline structures. The basilar cisterns are patent. No extra-axial fluid collections. Ventricles: Normal size and configuration of the ventricles. Vascular: Skull base flow voids are visualized. Skull and upper cervical spine: No focal abnormality. Sinuses/Orbits: Orbits are symmetric. Paranasal sinuses are clear. Other: Mastoid air cells are clear. IMPRESSION: No acute intracranial abnormality. Electronically Signed   By: Emily Filbert M.D.   On: 10/25/2023 17:03   DG Chest Portable 1 View Result Date: 10/25/2023 CLINICAL DATA:  Chest pain EXAM: PORTABLE CHEST 1 VIEW COMPARISON:  None Available. FINDINGS: Normal lung volumes. No focal consolidations. No pleural effusion or pneumothorax. The heart size and mediastinal contours are within normal limits. No acute osseous abnormality. IMPRESSION: No acute disease. Electronically Signed   By: Agustin Cree M.D.   On: 10/25/2023 13:04   CT HEAD WO CONTRAST Result Date: 10/25/2023 CLINICAL DATA:  Left arm numbness. EXAM: CT HEAD WITHOUT CONTRAST TECHNIQUE: Contiguous axial images were obtained from the base of the skull through the vertex without intravenous contrast. RADIATION DOSE REDUCTION: This exam  was performed according to the departmental dose-optimization program which includes automated exposure control, adjustment of the mA and/or kV according to patient size and/or use of iterative reconstruction technique. COMPARISON:  None Available. FINDINGS: Brain: No evidence of acute infarction, hemorrhage, hydrocephalus, extra-axial collection or mass lesion/mass effect. Vascular: No hyperdense vessel or unexpected calcification. Skull: Normal. Negative for fracture or focal lesion. Sinuses/Orbits: No acute finding. Other: None. IMPRESSION: 1. No acute intracranial abnormality. Electronically Signed   By: Obie Dredge M.D.   On: 10/25/2023 11:49    Procedures Procedures  Medications Ordered in ED Medications  sodium chloride 0.9 % bolus 1,000 mL (0 mLs Intravenous Stopped 10/25/23 1718)  ketorolac (TORADOL) 30 MG/ML injection 30 mg (30 mg Intravenous Given 10/25/23 1210)  metoCLOPramide (REGLAN) injection 10 mg (10 mg Intravenous Given 10/25/23 1211)  diphenhydrAMINE (BENADRYL) injection 12.5 mg (12.5 mg Intravenous Given 10/25/23 1210)    ED Course/ Medical Decision Making/ A&P                                 Medical Decision Making Amount and/or Complexity of Data Reviewed Labs: ordered. Radiology: ordered.  Risk Prescription drug management.   This patient presents to the ED for concern of facial pain, numbness and difficulty swallowing, this involves an extensive number of treatment options, and is a complaint that carries with it a high risk of complications and morbidity.  The differential diagnosis includes cva, electrolyte abn, anemia, drug/alcohol intox   Co morbidities that complicate the patient evaluation  depression   Additional history obtained:  Additional history obtained from epic chart review  Lab Tests:  I Ordered, and personally interpreted labs.  The pertinent results include:  cmp nl other than mild elevation of LFTs (AST 141 and ALT 97), etoh elevated  at 257, inr 1.1, cbc nl   Imaging Studies ordered:  I ordered imaging studies including ct head, cxr, mri  I independently visualized and interpreted imaging which showed  CT head: No acute intracranial abnormality.  MRI brain: No acute intracranial abnormality.  CXR: No acute disease.  I agree with the radiologist interpretation   Cardiac Monitoring:  The patient was maintained on a cardiac monitor.  I personally viewed and interpreted the cardiac monitored which showed an underlying rhythm of: nsr   Medicines ordered and prescription drug management:  I ordered medication including ivfs/toradol/reglan/benadryl  for sx  Reevaluation of the patient after these medicines showed that the patient improved I have reviewed the patients home medicines and have made adjustments as needed   Test Considered:  Ct/mri   Critical Interventions:  ivfs   Problem List / ED Course:  Headache and difficulty swallowing:  likely related to etoh abuse.  Pt said she's been very stressed.  She's going through a divorce and has been isolated at home.  She wants to stop drinking.  She is started on librium and is d/c with protonix.  She is to return if worse.  F/u with pcp.   Reevaluation:  After the interventions noted above, I reevaluated the patient and found that they have :improved   Social Determinants of Health:  Lives at home   Dispostion:  After consideration of the diagnostic results and the patients response to treatment, I feel that the patent would benefit from discharge with outpatient f/u.          Final Clinical Impression(s) / ED Diagnoses Final diagnoses:  Alcoholic intoxication without complication (HCC)  Acute nonintractable headache, unspecified headache type    Rx / DC Orders ED Discharge Orders          Ordered    chlordiazePOXIDE (LIBRIUM) 25 MG capsule        10/25/23 1714    pantoprazole (PROTONIX) 20 MG tablet  Daily        10/25/23 1714               Jacalyn Lefevre, MD 10/25/23 1734

## 2023-10-25 NOTE — ED Triage Notes (Signed)
 Pt came in via POV d/t a couple of days ago noticing that she had some numbness in her Lt arm & difficulty swallowing. Does feel some pressure behind her Lt eye & Lt side of her head. Reports a week ago she had a benign sample removed from her Lt breast. A/Ox4, no facial droop or unilateral arm droop noted in triage. Reports the sensation feels the same on both hands.

## 2024-01-06 ENCOUNTER — Emergency Department (HOSPITAL_BASED_OUTPATIENT_CLINIC_OR_DEPARTMENT_OTHER): Admitting: Radiology

## 2024-01-06 ENCOUNTER — Encounter (HOSPITAL_BASED_OUTPATIENT_CLINIC_OR_DEPARTMENT_OTHER): Payer: Self-pay | Admitting: Emergency Medicine

## 2024-01-06 ENCOUNTER — Emergency Department (HOSPITAL_BASED_OUTPATIENT_CLINIC_OR_DEPARTMENT_OTHER)
Admission: EM | Admit: 2024-01-06 | Discharge: 2024-01-06 | Disposition: A | Discharge status: Home / Self Care | Attending: Emergency Medicine | Admitting: Emergency Medicine

## 2024-01-06 ENCOUNTER — Other Ambulatory Visit: Payer: Self-pay

## 2024-01-06 DIAGNOSIS — F10930 Alcohol use, unspecified with withdrawal, uncomplicated: Secondary | ICD-10-CM

## 2024-01-06 DIAGNOSIS — F10239 Alcohol dependence with withdrawal, unspecified: Secondary | ICD-10-CM | POA: Diagnosis not present

## 2024-01-06 DIAGNOSIS — R0602 Shortness of breath: Secondary | ICD-10-CM | POA: Diagnosis present

## 2024-01-06 DIAGNOSIS — Y906 Blood alcohol level of 120-199 mg/100 ml: Secondary | ICD-10-CM | POA: Diagnosis not present

## 2024-01-06 DIAGNOSIS — F419 Anxiety disorder, unspecified: Secondary | ICD-10-CM | POA: Diagnosis not present

## 2024-01-06 LAB — CBC WITH DIFFERENTIAL/PLATELET
Abs Immature Granulocytes: 0.02 10*3/uL (ref 0.00–0.07)
Basophils Absolute: 0 10*3/uL (ref 0.0–0.1)
Basophils Relative: 0 %
Eosinophils Absolute: 0.1 10*3/uL (ref 0.0–0.5)
Eosinophils Relative: 1 %
HCT: 42.9 % (ref 36.0–46.0)
Hemoglobin: 15 g/dL (ref 12.0–15.0)
Immature Granulocytes: 0 %
Lymphocytes Relative: 32 %
Lymphs Abs: 1.6 10*3/uL (ref 0.7–4.0)
MCH: 34.8 pg — ABNORMAL HIGH (ref 26.0–34.0)
MCHC: 35 g/dL (ref 30.0–36.0)
MCV: 99.5 fL (ref 80.0–100.0)
Monocytes Absolute: 0.6 10*3/uL (ref 0.1–1.0)
Monocytes Relative: 11 %
Neutro Abs: 2.7 10*3/uL (ref 1.7–7.7)
Neutrophils Relative %: 56 %
Platelets: 159 10*3/uL (ref 150–400)
RBC: 4.31 MIL/uL (ref 3.87–5.11)
RDW: 11.8 % (ref 11.5–15.5)
WBC: 4.9 10*3/uL (ref 4.0–10.5)
nRBC: 0 % (ref 0.0–0.2)

## 2024-01-06 LAB — URINALYSIS, ROUTINE W REFLEX MICROSCOPIC
Bacteria, UA: NONE SEEN
Glucose, UA: NEGATIVE mg/dL
Ketones, ur: 15 mg/dL — AB
Nitrite: NEGATIVE
Protein, ur: 100 mg/dL — AB
Specific Gravity, Urine: 1.029 (ref 1.005–1.030)
pH: 6 (ref 5.0–8.0)

## 2024-01-06 LAB — URINE DRUG SCREEN
Amphetamines: NOT DETECTED
Barbiturates: NOT DETECTED
Benzodiazepines: DETECTED — AB
Cocaine: NOT DETECTED
Fentanyl: NOT DETECTED
Methadone Scn, Ur: NOT DETECTED
Opiates: NOT DETECTED
Tetrahydrocannabinol: NOT DETECTED

## 2024-01-06 LAB — RESP PANEL BY RT-PCR (RSV, FLU A&B, COVID)  RVPGX2
Influenza A by PCR: NEGATIVE
Influenza B by PCR: NEGATIVE
Resp Syncytial Virus by PCR: NEGATIVE
SARS Coronavirus 2 by RT PCR: NEGATIVE

## 2024-01-06 LAB — TROPONIN T, HIGH SENSITIVITY: Troponin T High Sensitivity: <15 ng/L (ref <19)

## 2024-01-06 LAB — COMPREHENSIVE METABOLIC PANEL WITH GFR
ALT: 188 U/L — ABNORMAL HIGH (ref 0–44)
AST: 240 U/L — ABNORMAL HIGH (ref 15–41)
Albumin: 4.9 g/dL (ref 3.5–5.0)
Alkaline Phosphatase: 68 U/L (ref 38–126)
Anion gap: 19 — ABNORMAL HIGH (ref 5–15)
BUN: <5 mg/dL — ABNORMAL LOW (ref 6–20)
CO2: 24 mmol/L (ref 22–32)
Calcium: 10.3 mg/dL (ref 8.9–10.3)
Chloride: 97 mmol/L — ABNORMAL LOW (ref 98–111)
Creatinine, Ser: 0.69 mg/dL (ref 0.44–1.00)
GFR, Estimated: >60 mL/min (ref >60)
Glucose, Bld: 136 mg/dL — ABNORMAL HIGH (ref 70–99)
Potassium: 3.4 mmol/L — ABNORMAL LOW (ref 3.5–5.1)
Sodium: 139 mmol/L (ref 135–145)
Total Bilirubin: 1.1 mg/dL (ref 0.0–1.2)
Total Protein: 7.7 g/dL (ref 6.5–8.1)

## 2024-01-06 LAB — ETHANOL: Alcohol, Ethyl (B): 131 mg/dL — ABNORMAL HIGH (ref <15)

## 2024-01-06 LAB — D-DIMER, QUANTITATIVE: D-Dimer, Quant: 0.28 ug{FEU}/mL (ref 0.00–0.50)

## 2024-01-06 LAB — TSH: TSH: 1.82 u[IU]/mL (ref 0.350–4.500)

## 2024-01-06 MED ORDER — LORAZEPAM 1 MG PO TABS
2.0000 mg | ORAL_TABLET | Freq: Once | ORAL | Status: AC
  Administered 2024-01-06: 2 mg via ORAL
  Filled 2024-01-06: qty 2

## 2024-01-06 MED ORDER — LORAZEPAM 1 MG PO TABS
0.0000 mg | ORAL_TABLET | Freq: Two times a day (BID) | ORAL | Status: DC
Start: 2024-01-08 — End: 2024-01-06

## 2024-01-06 MED ORDER — LORAZEPAM 2 MG/ML IJ SOLN
2.0000 mg | Freq: Once | INTRAMUSCULAR | Status: AC
  Administered 2024-01-06: 2 mg via INTRAVENOUS
  Filled 2024-01-06: qty 1

## 2024-01-06 MED ORDER — THIAMINE MONONITRATE 100 MG PO TABS: 100.0000 mg | ORAL_TABLET | Freq: Every day | ORAL | Status: DC

## 2024-01-06 MED ORDER — THIAMINE HCL 100 MG/ML IJ SOLN: 100.0000 mg | Freq: Every day | INTRAMUSCULAR | Status: DC

## 2024-01-06 MED ORDER — LORAZEPAM 2 MG/ML IJ SOLN: 1.0000 mg | INTRAMUSCULAR | Status: DC | PRN

## 2024-01-06 MED ORDER — CHLORDIAZEPOXIDE HCL 25 MG PO CAPS: ORAL_CAPSULE | ORAL | 0 refills | Status: AC

## 2024-01-06 MED ORDER — LORAZEPAM 1 MG PO TABS
0.0000 mg | ORAL_TABLET | Freq: Four times a day (QID) | ORAL | Status: DC
Start: 2024-01-06 — End: 2024-01-06
  Administered 2024-01-06: 1 mg via ORAL
  Filled 2024-01-06: qty 1

## 2024-01-06 MED ORDER — FOLIC ACID 1 MG PO TABS: 1.0000 mg | ORAL_TABLET | Freq: Every day | ORAL | Status: DC

## 2024-01-06 MED ORDER — LORAZEPAM 1 MG PO TABS: 1.0000 mg | ORAL_TABLET | ORAL | Status: DC | PRN

## 2024-01-06 MED ORDER — ADULT MULTIVITAMIN W/MINERALS CH
1.0000 | ORAL_TABLET | Freq: Every day | ORAL | Status: DC
Start: 2024-01-06 — End: 2024-01-06

## 2024-01-06 NOTE — ED Triage Notes (Signed)
 Patient reports "not feeling like herself" for 2 weeks. She reports increased anxiety, shortness of breath, and heart palpitations. She states these symptoms are intermittent. Recently tested for Graves disease but has not received results yet. Patient also states she is trying to quit drinking. Patient reports she was drinking 4 drinks of wine or vodka every day and last drink was 11pm last night.

## 2024-01-06 NOTE — Discharge Instructions (Addendum)
 Follow-up with the resources provided to help with quitting drinking.  No evidence of heart attack or blood clot in the lung today.  If you are going to continue to drink you should not take Librium . Return to the ED with exertional chest pain, pain associate with shortness of breath, nausea, vomiting, sweating or other concerns.

## 2024-01-06 NOTE — ED Provider Notes (Signed)
  Physical Exam  BP (!) 179/104   Pulse 86   Temp 98.8 F (37.1 C) (Oral)   Resp 19   Ht 5\' 7"  (1.702 m)   Wt 81.6 kg   SpO2 100%   BMI 28.19 kg/m   Physical Exam  Procedures  Procedures  ED Course / MDM    Medical Decision Making Amount and/or Complexity of Data Reviewed Labs: ordered. Radiology: ordered.  Risk OTC drugs. Prescription drug management.   48 year old female signed out to me pending labs and reassessment.  Based on my examination the patient, believe her symptoms are alcohol withdrawal.  Patient reports to me that she has been trying to cut back on her drinking.  Alcohol level 131, is experiencing mild withdrawal symptoms.  I was able to get the symptoms under control with Ativan.  Dr. Alison Irvine had prescribed patient Librium  taper.  Will discharge.       Afton Horse T, DO 01/06/24 [phone]

## 2024-01-06 NOTE — ED Provider Notes (Signed)
 [place] EMERGENCY DEPARTMENT AT [facility] Provider Note   CSN: [id number] Arrival date & time: 01/06/24  4098     History  Chief Complaint  Patient presents with   Shortness of Breath    Melissa [name] is a 48 y.o. female.  Patient with a history of depression and alcohol abuse presents with "not feeling like herself" for the past 1 week.  States she feels fatigued, short of breath, anxious, tight in her chest.  Having difficulty sleeping and waking up every few hours.  Having intermittent chest tightness and palpitations that come and go lasting for several hours at a time.  Was exposed to COVID last week with tested negative at home.  Feels sore and achy all over, "lethargic", nauseous, short of breath, lightheaded, palpitations and tightness in her chest. Several episodes of diarrhea as well.  No vomiting.  No abdominal pain.  No headache or chest pain Does admit to feeling depressed but denies suicidal homicidal thoughts. States she drinks about 4 drinks a day of wine or vodka.  Last drink was 11 PM last night.  Has not tried to cut back recently.  States her drinking is the same as what it has been in the past. Denies any other drug use.  The history is provided by the patient.  Shortness of Breath Associated symptoms: no chest pain, no fever, no rash and no vomiting        Home Medications Prior to Admission medications   Medication Sig Start Date End Date Taking? Authorizing Provider  chlordiazePOXIDE  (LIBRIUM ) 25 MG capsule 50mg  PO TID x 1D, then 25-50mg  PO BID X 1D, then 25-50mg  PO QD X 1D 10/25/23   Sueellen Emery, MD  ibuprofen  (ADVIL ) 200 MG tablet Take 400 mg by mouth 2 (two) times daily as needed for mild pain (pain score 1-3) or moderate pain (pain score 4-6).    [provider]  pantoprazole  (PROTONIX ) 20 MG tablet Take 1 tablet (20 mg total) by mouth daily. 10/25/23   Haviland, Julie, MD      Allergies    Patient has no known allergies.     Review of Systems   Review of Systems  Constitutional:  Positive for fatigue. Negative for activity change, appetite change and fever.  HENT:  Negative for congestion and rhinorrhea.   Respiratory:  Positive for chest tightness and shortness of breath.   Cardiovascular:  Positive for palpitations. Negative for chest pain.  Gastrointestinal:  Positive for constipation. Negative for vomiting.  Genitourinary:  Negative for dysuria and frequency.  Musculoskeletal:  Positive for arthralgias and myalgias.  Skin:  Negative for rash.  Neurological:  Positive for weakness and light-headedness.    Physical Exam Updated Vital Signs BP (!) 182/122   Pulse 98   Temp 98.8 F (37.1 C) (Oral)   Resp 16   Ht 5\' 7"  (1.702 m)   Wt 81.6 kg   SpO2 96%   BMI 28.19 kg/m  Physical Exam Vitals and nursing note reviewed.  Constitutional:      General: She is not in acute distress.    Appearance: She is well-developed.     Comments: Tearful  HENT:     Head: Normocephalic and atraumatic.     Mouth/Throat:     Pharynx: No oropharyngeal exudate.  Eyes:     Conjunctiva/sclera: Conjunctivae normal.     Pupils: Pupils are equal, round, and reactive to light.  Neck:     Comments: No meningismus. Cardiovascular:  Rate and Rhythm: Normal rate and regular rhythm.     Heart sounds: Normal heart sounds. No murmur heard. Pulmonary:     Effort: Pulmonary effort is normal. No respiratory distress.     Breath sounds: Normal breath sounds.  Abdominal:     Palpations: Abdomen is soft.     Tenderness: There is no abdominal tenderness. There is no guarding or rebound.  Musculoskeletal:        General: No tenderness. Normal range of motion.     Cervical back: Normal range of motion and neck supple.  Skin:    General: Skin is warm.  Neurological:     Mental Status: She is alert and oriented to person, place, and time.     Cranial Nerves: No cranial nerve deficit.     Motor: No abnormal muscle tone.      Coordination: Coordination normal.     Comments:  5/5 strength throughout. CN 2-12 intact.Equal grip strength.  Mildly tremulous  Psychiatric:        Behavior: Behavior normal.     ED Results / Procedures / Treatments   Labs (all labs ordered are listed, but only abnormal results are displayed) Labs Reviewed  RESP PANEL BY RT-PCR (RSV, FLU A&B, COVID)  [id number]  CBC WITH DIFFERENTIAL/PLATELET  COMPREHENSIVE METABOLIC PANEL WITH GFR  D-DIMER, QUANTITATIVE  ETHANOL  URINALYSIS, ROUTINE W REFLEX MICROSCOPIC  URINE DRUG SCREEN  TSH  TROPONIN T, HIGH SENSITIVITY    EKG EKG Interpretation Date/Time:  Thursday Jan 06 2024 06:17:16 EDT Ventricular Rate:  92 PR Interval:  162 QRS Duration:  93 QT Interval:  356 QTC Calculation: 441 R Axis:   58  Text Interpretation: Sinus rhythm Probable left atrial enlargement No significant change was found Confirmed by Earma Gloss [id number]) on 01/06/2024 6:30:48 AM  Radiology No results found.  Procedures Procedures    Medications Ordered in ED Medications  LORazepam (ATIVAN) tablet 1-4 mg (has no administration in time range)    Or  LORazepam (ATIVAN) injection 1-4 mg (has no administration in time range)  thiamine (VITAMIN B1) tablet 100 mg (has no administration in time range)    Or  thiamine (VITAMIN B1) injection 100 mg (has no administration in time range)  folic acid (FOLVITE) tablet 1 mg (has no administration in time range)  multivitamin with minerals tablet 1 tablet (has no administration in time range)  LORazepam (ATIVAN) tablet 0-4 mg (has no administration in time range)    Followed by  LORazepam (ATIVAN) tablet 0-4 mg (has no administration in time range)    ED Course/ Medical Decision Making/ A&P                                 Medical Decision Making Amount and/or Complexity of Data Reviewed Labs: ordered. Decision-making details documented in ED Course. Radiology: ordered and independent interpretation  performed. Decision-making details documented in ED Course. ECG/medicine tests: ordered and independent interpretation performed. Decision-making details documented in ED Course.  Risk OTC drugs. Prescription drug management.   1 week of palpitations, anxiety, shortness of breath, chest tightness.  Hypertensive and tremulous on arrival.  Concern for alcohol withdrawal although she denies any recent cutting back.  EKG is sinus rhythm without acute ST changes.  Low concern for ACS or PE.  Suspect majority of her symptoms are due to depression and early alcohol withdrawal.  She denies any suicidal or homicidal thoughts.  Will  hydrate, check labs, proceed with CIWA protocol.  Initial CIWA score is 7.  She is not tremulous but she is hypertensive and anxious.  Low suspicion for ACS or PE but will trend troponin and obtain EKG and chest x-ray.  She does want to quit drinking.  She is agreeable to Librium  taper.  Labs pending at shift change.  If her vital signs and CIWA scores improve anticipate she can be discharged home with Librium  taper if she agrees to quit drinking. Will trend troponin and check D-dimer to rule out PE but low suspicion for such. She is anxious but not suicidal.  Would likely benefit from therapy resources which we provided.        Final Clinical Impression(s) / ED Diagnoses Final diagnoses:  Alcohol withdrawal syndrome without complication [facility])  Anxiety    Rx / DC Orders ED Discharge Orders     None         Able Malloy, Mara [place], MD 01/06/24 [phone]
